# Patient Record
Sex: Female | Born: 1970 | Race: Black or African American | Hispanic: No | Marital: Married | Smoking: Never smoker
Health system: Southern US, Community
[De-identification: ages and names within clinical notes are randomized; demographics above are authoritative.]

## PROBLEM LIST (undated history)

## (undated) ENCOUNTER — Ambulatory Visit (HOSPITAL_COMMUNITY): Admission: EM | Payer: Federal, State, Local not specified - PPO

## (undated) DIAGNOSIS — D649 Anemia, unspecified: Secondary | ICD-10-CM

## (undated) DIAGNOSIS — K5900 Constipation, unspecified: Secondary | ICD-10-CM

## (undated) DIAGNOSIS — B9689 Other specified bacterial agents as the cause of diseases classified elsewhere: Secondary | ICD-10-CM

## (undated) DIAGNOSIS — N760 Acute vaginitis: Secondary | ICD-10-CM

## (undated) DIAGNOSIS — O99119 Other diseases of the blood and blood-forming organs and certain disorders involving the immune mechanism complicating pregnancy, unspecified trimester: Secondary | ICD-10-CM

## (undated) DIAGNOSIS — D696 Thrombocytopenia, unspecified: Secondary | ICD-10-CM

## (undated) DIAGNOSIS — R922 Inconclusive mammogram: Secondary | ICD-10-CM

## (undated) DIAGNOSIS — D693 Immune thrombocytopenic purpura: Secondary | ICD-10-CM

## (undated) HISTORY — DX: Other specified bacterial agents as the cause of diseases classified elsewhere: B96.89

## (undated) HISTORY — DX: Other specified bacterial agents as the cause of diseases classified elsewhere: N76.0

## (undated) HISTORY — DX: Inconclusive mammogram: R92.2

## (undated) HISTORY — DX: Thrombocytopenia, unspecified: D69.6

## (undated) HISTORY — DX: Immune thrombocytopenic purpura: D69.3

## (undated) HISTORY — PX: TUBAL LIGATION: SHX77

## (undated) HISTORY — DX: Anemia, unspecified: D64.9

## (undated) HISTORY — DX: Other diseases of the blood and blood-forming organs and certain disorders involving the immune mechanism complicating pregnancy, unspecified trimester: O99.119

## (undated) HISTORY — DX: Constipation, unspecified: K59.00

---

## 2000-04-10 ENCOUNTER — Encounter: Payer: Self-pay | Admitting: Internal Medicine

## 2000-04-10 ENCOUNTER — Encounter: Admission: RE | Admit: 2000-04-10 | Discharge: 2000-04-10 | Payer: Self-pay | Admitting: Internal Medicine

## 2000-04-29 ENCOUNTER — Other Ambulatory Visit: Admission: RE | Admit: 2000-04-29 | Discharge: 2000-04-29 | Payer: Self-pay | Admitting: Obstetrics and Gynecology

## 2001-02-18 ENCOUNTER — Encounter (INDEPENDENT_AMBULATORY_CARE_PROVIDER_SITE_OTHER): Payer: Self-pay

## 2001-02-18 ENCOUNTER — Inpatient Hospital Stay (HOSPITAL_COMMUNITY): Admission: AD | Admit: 2001-02-18 | Discharge: 2001-02-21 | Payer: Self-pay | Admitting: Obstetrics and Gynecology

## 2001-02-18 HISTORY — PX: TUBAL LIGATION: SHX77

## 2001-05-17 ENCOUNTER — Other Ambulatory Visit: Admission: RE | Admit: 2001-05-17 | Discharge: 2001-05-17 | Payer: Self-pay | Admitting: Obstetrics and Gynecology

## 2001-05-28 ENCOUNTER — Encounter: Payer: Self-pay | Admitting: Hematology and Oncology

## 2001-05-28 ENCOUNTER — Encounter: Admission: RE | Admit: 2001-05-28 | Discharge: 2001-05-28 | Payer: Self-pay | Admitting: Hematology and Oncology

## 2002-05-24 ENCOUNTER — Other Ambulatory Visit: Admission: RE | Admit: 2002-05-24 | Discharge: 2002-05-24 | Payer: Self-pay | Admitting: Obstetrics and Gynecology

## 2003-07-28 ENCOUNTER — Other Ambulatory Visit: Admission: RE | Admit: 2003-07-28 | Discharge: 2003-07-28 | Payer: Self-pay | Admitting: Obstetrics and Gynecology

## 2004-07-29 ENCOUNTER — Other Ambulatory Visit: Admission: RE | Admit: 2004-07-29 | Discharge: 2004-07-29 | Payer: Self-pay | Admitting: Obstetrics and Gynecology

## 2005-09-08 ENCOUNTER — Encounter: Admission: RE | Admit: 2005-09-08 | Discharge: 2005-09-08 | Payer: Self-pay | Admitting: Obstetrics and Gynecology

## 2005-09-08 ENCOUNTER — Other Ambulatory Visit: Admission: RE | Admit: 2005-09-08 | Discharge: 2005-09-08 | Payer: Self-pay | Admitting: Obstetrics and Gynecology

## 2006-03-17 DIAGNOSIS — R922 Inconclusive mammogram: Secondary | ICD-10-CM

## 2006-03-17 DIAGNOSIS — R9230 Dense breasts, unspecified: Secondary | ICD-10-CM

## 2006-03-17 HISTORY — DX: Dense breasts, unspecified: R92.30

## 2006-03-17 HISTORY — DX: Inconclusive mammogram: R92.2

## 2006-11-03 ENCOUNTER — Encounter: Admission: RE | Admit: 2006-11-03 | Discharge: 2006-11-03 | Payer: Self-pay | Admitting: Obstetrics and Gynecology

## 2008-08-24 ENCOUNTER — Encounter: Admission: RE | Admit: 2008-08-24 | Discharge: 2008-08-24 | Payer: Self-pay | Admitting: Internal Medicine

## 2008-09-07 ENCOUNTER — Encounter: Admission: RE | Admit: 2008-09-07 | Discharge: 2008-09-07 | Payer: Self-pay | Admitting: Internal Medicine

## 2009-09-21 ENCOUNTER — Encounter: Admission: RE | Admit: 2009-09-21 | Discharge: 2009-09-21 | Payer: Self-pay | Admitting: Internal Medicine

## 2009-11-01 HISTORY — PX: ENDOMETRIAL BIOPSY: SHX622

## 2010-08-02 NOTE — Op Note (Signed)
[facility]  Patient:    Anna Powell, Anna Powell Visit Number: [id number] MRN: [id number]          Service Type: OBS Location: 910A 9109 01 Attending Physician:  Jaymes Graff A Dictated by:   Pierre Bali Normand Sloop, M.D. Admit Date:  02/18/2001                             Operative Report  PREOPERATIVE DIAGNOSES:       1. Term pregnancy.                               2. Desires repeat cesarean section.                               3. Multiparity.                               4. Desires sterilization.  POSTOPERATIVE DIAGNOSES:      1. Term pregnancy.                               2. Desires repeat cesarean section.                               3. Multiparity.                               4. Desires sterilization.  PROCEDURES:                   1. Repeat cesarean section.                               2. Bilateral tubal ligation.  SURGEON:                      Naima A. Normand Sloop, M.D.  ASSISTANT:                    Maris Berger. Pennie Rushing, M.D.  ANESTHESIA:                   Spinal.  ESTIMATED BLOOD LOSS:         500 cc.  IV FLUIDS:                    3300 cc of crystalloid.  URINE OUTPUT:                 350 cc clear urine at the end of the procedure.  FINDINGS:                     Female infant in the vertex presentation.  Apgars 5 and 7.  However, the infant was crying when brought to the pediatricians, so no cord gas was sent.  The infant did have a nuchal cord x 1 that was easily reduced.  Weight was 8 lb 1 oz.  No meconium.  She had normal uterus, tubes and ovaries.  The placenta was sent to pathology.  PROCEDURE IN DETAIL:  The patient was taken to the operating room, where she was given spinal anesthesia.  She was placed in the dorsal supine position with a left lateral tilt, prepped and draped in normal sterile fashion.  A Pfannenstiel skin incision was made along her old incision with a knife and carried down to the fascia.  The fascia  was incised in the midline and dissected bilaterally using Mayo scissors and meticulous technique. Kochers x 2 were then placed over the superior aspect of the fascia, which was separated off the rectus muscles both bluntly and sharply using Mayo scissors.  The inferior aspect of the fascia was dissected in a similar fashion.  The rectus muscles were then separated in the midline.  The peritoneum was identified, tented up and entered sharply with Metzenbaum scissors.  The incision was dissected superiorly and inferiorly with good visualization of bowel and bladder.  The vesicouterine peritoneum was identified, tented up and entered sharply, then separated from the uterus both bluntly and sharply.  A bladder blade was then inserted.  A lower transverse uterine incision was then made with a knife and the incision was extended bilaterally bluntly.  the head was delivered without difficulty through the incision.  A nuchal cord x 1 was easily reduced.  The body was delivered without difficulty.  The cord was clamped and cut.  Cord bloods were obtained.  The placenta was manually removed without difficulty.  The uterus was cleared of all clot and debris. The uterine incision was repaired with 0 Vicryl in a running lock fashion. The patients left fallopian tube was then identified, followed to the fimbriated end, grasped in the midisthmic portion with a Babcock clamp and suture ligated with 2-0 plain.  It was then doubly ligated with a free tie.  A portion of the tube was removed.  Hemostasis was noted.  The patients right fallopian tube was identified, manipulated in a similar fashion, suture ligated with 2-0 plain and a portion of the tube excised in the midisthmic portion.  Hemostasis was again noted.  Attention was then turned back to the incision.  There was a little bit of oozing near the left angle.  This was made hemostatic with a figure-of-eight stitch.  The abdomen was irrigated  with saline.  Hemostasis was assured.  The muscle was reapproximated with a figure-of-eight stitch using 2-0 Vicryl.  Hemostasis was assured.  The fascia was then repaired with 0 Vicryl in a running fashion.  Hemostasis was assured. The subcutaneous tissue was irrigated copiously and made hemostatic using Bovie cautery.  Hemostasis was assured.  The skin was closed with staples. Sponge, lap, and needle counts were correct x 2.  The patient went to the recovery room in stable condition. Dictated by:   Pierre Bali. Normand Sloop, M.D. Attending Physician:  Michael Litter DD:  02/18/01 TD:  02/18/01 Job: [id number]

## 2010-08-02 NOTE — H&P (Signed)
[facility]  Patient:    Anna Powell, Anna Powell Visit Number: [id number] MRN: [id number]          Service Type: Attending:  Naima A. Normand Sloop, M.D. Dictated by:   Pierre Bali. Normand Sloop, M.D.                           History and Physical  HISTORY OF PRESENT ILLNESS:   Patient is a 40 year old African-American, female, G3, P1-0-1-1.  Last menstrual period was May 29, 2000.  Estimated date of confinement was February 25, 2001, consistent with a 19-week ultrasound.  Estimated gestational age is 13 weeks.  Patient presents to the [facility] for a repeat cesarean section, tubal ligation.  Patients pregnancy was complicated by thrombocytopenia.  First prenatal values were 125 and every month, they were 106, 109, 103, and 110, and have remained low but stable.  She is also rubella nonimmune and needs vaccination postpartum and has a history of previous cesarean section and has decided on repeat cesarean section and tubal ligation.  PRENATAL LABS:                Prenatal hemoglobin was 10.9 and hematocrit 35.2.  O positive, antibody negative.  Sickle cell prep was negative.  RPR was nonreactive, rubella nonimmune.  Hepatitis B surface antigen was negative. Pap was within normal limits.  Gonorrhea and Chlamydia both were negative. Ultrasound was size equal to dates.  A one-hour glucola was 114.  GBS was negative.  MEDICATIONS:                  1. Prenatal vitamins.                               2. Claritin p.r.n.  PAST OB HISTORY:              Elective abortion in 1988 without any complications and a full-term cesarean section in August 1996 secondary to failure to descend.  PAST GYN HISTORY:             Menarche at age 44 and occurring every 53 to 30 days, lasting for five to seven days.  No history of sexually transmitted diseases.  ALLERGIES:                    Patient has just SEASONAL ALLERGIES but no known drug allergies.  FAMILY HISTORY:                Significant for hypertension.  PHYSICAL EXAMINATION:         On November 26 in the office, patient weighed 180.5 pounds, fetal heart tones were 140s.  Blood pressure was 112/62, and her last cervix exam was found to be fingertip, long, and high.  The remainder of her exam will be done at her admission.  PLAN:                         Patient will be admitted for cesarean section and tubal ligation.  The risks are but not limited to bleeding, infection, damage to major abdominal organs such as bowel and bladder.  Patient was given the option to vaginal birth after cesarean earlier in the pregnancy and decided for repeat cesarean section. Dictated by:   Pierre Bali. Normand Sloop, M.D. Attending:  Naima A. Dillard, M.D. DD:  02/16/01 TD:  02/16/01 Job:  [id number]

## 2010-08-02 NOTE — Discharge Summary (Signed)
[facility]  Patient:    Anna Powell, Anna Powell Visit Number: [id number] MRN: [id number]          Service Type: OBS Location: 910A 9109 01 Attending Physician:  Michael Litter Dictated by:   Mack Guise, C.N.M. Admit Date:  02/18/2001 Discharge Date: 02/21/2001                             Discharge Summary  ADMISSION DIAGNOSES: 1. Intrauterine pregnancy at term. 2. Repeat cesarean section. 3. Desires sterilization.  PROCEDURES: 1. Repeat low transverse cesarean delivery. 2. Bilateral tubal sterilization.  DISCHARGE DIAGNOSES: 1. Intrauterine pregnancy at term. 2. Repeat cesarean section. 3. Desires sterilization.  HISTORY OF PRESENT ILLNESS:  Ms. Delos Haring is a 40 year old, gravida 3, para 1-0-1-1, who presented for repeat cesarean section and tubal ligation.  Her pregnancy was complicated by thrombocytopenia, but she has remained stable, and has done well in the postoperative period.  On 02/18/01, she underwent a repeat cesarean delivery and bilateral tubal sterilization by Dr. Jaymes Graff with the birth of an 8 pound 1 ounce female infant with Apgar scores of 5 at one minute, 7 at five minutes, and 9 at 10 minutes.  Her hemoglobin on the first postoperative day was 9.3, platelets 96,000, and on this her third postoperative day, she is judged to be in satisfactory condition for discharge.  Both mother and baby have done well in the immediate postpartum period.  DISCHARGE INSTRUCTIONS:  Per [facility] handout.  DISCHARGE MEDICATIONS: 1. Motrin 600 mg p.o. q.6h. p.r.n. pain. 2. Tylox one or two p.o. q.3-4h. pain. 3. Prenatal vitamins.  FOLLOWUP:  The patient will follow up at the office of [facility] in 6 weeks. Dictated by:   Mack Guise, C.N.M. Attending Physician:  Michael Litter DD:  02/21/01 TD:  02/21/01 Job: [id number]

## 2010-09-16 ENCOUNTER — Other Ambulatory Visit: Payer: Self-pay | Admitting: Internal Medicine

## 2010-09-16 DIAGNOSIS — Z1231 Encounter for screening mammogram for malignant neoplasm of breast: Secondary | ICD-10-CM

## 2010-09-27 ENCOUNTER — Ambulatory Visit
Admission: RE | Admit: 2010-09-27 | Discharge: 2010-09-27 | Disposition: A | Discharge status: Home / Self Care | Payer: Federal, State, Local not specified - PPO | Source: Ambulatory Visit | Attending: Internal Medicine | Admitting: Internal Medicine

## 2010-09-27 DIAGNOSIS — Z1231 Encounter for screening mammogram for malignant neoplasm of breast: Secondary | ICD-10-CM

## 2010-10-02 ENCOUNTER — Other Ambulatory Visit: Payer: Self-pay | Admitting: Internal Medicine

## 2010-10-02 DIAGNOSIS — R928 Other abnormal and inconclusive findings on diagnostic imaging of breast: Secondary | ICD-10-CM

## 2010-10-07 ENCOUNTER — Ambulatory Visit
Admission: RE | Admit: 2010-10-07 | Discharge: 2010-10-07 | Disposition: A | Discharge status: Home / Self Care | Payer: Federal, State, Local not specified - PPO | Source: Ambulatory Visit | Attending: Internal Medicine | Admitting: Internal Medicine

## 2010-10-07 DIAGNOSIS — R928 Other abnormal and inconclusive findings on diagnostic imaging of breast: Secondary | ICD-10-CM

## 2011-08-22 ENCOUNTER — Other Ambulatory Visit: Payer: Self-pay | Admitting: Internal Medicine

## 2011-08-22 DIAGNOSIS — Z1231 Encounter for screening mammogram for malignant neoplasm of breast: Secondary | ICD-10-CM

## 2011-09-24 ENCOUNTER — Ambulatory Visit: Payer: Federal, State, Local not specified - PPO | Admitting: Obstetrics and Gynecology

## 2011-10-08 ENCOUNTER — Ambulatory Visit: Payer: Federal, State, Local not specified - PPO

## 2011-10-14 ENCOUNTER — Ambulatory Visit: Payer: Federal, State, Local not specified - PPO

## 2011-11-03 ENCOUNTER — Ambulatory Visit: Payer: Federal, State, Local not specified - PPO | Admitting: Obstetrics and Gynecology

## 2011-11-03 ENCOUNTER — Ambulatory Visit
Admission: RE | Admit: 2011-11-03 | Discharge: 2011-11-03 | Disposition: A | Discharge status: Home / Self Care | Payer: Federal, State, Local not specified - PPO | Source: Ambulatory Visit | Attending: Internal Medicine | Admitting: Internal Medicine

## 2011-11-03 DIAGNOSIS — Z1231 Encounter for screening mammogram for malignant neoplasm of breast: Secondary | ICD-10-CM

## 2011-11-05 ENCOUNTER — Other Ambulatory Visit: Payer: Self-pay | Admitting: Internal Medicine

## 2011-11-05 DIAGNOSIS — R928 Other abnormal and inconclusive findings on diagnostic imaging of breast: Secondary | ICD-10-CM

## 2011-11-24 ENCOUNTER — Ambulatory Visit
Admission: RE | Admit: 2011-11-24 | Discharge: 2011-11-24 | Disposition: A | Discharge status: Home / Self Care | Payer: Federal, State, Local not specified - PPO | Source: Ambulatory Visit | Attending: Internal Medicine | Admitting: Internal Medicine

## 2011-11-24 DIAGNOSIS — R928 Other abnormal and inconclusive findings on diagnostic imaging of breast: Secondary | ICD-10-CM

## 2011-12-09 ENCOUNTER — Ambulatory Visit: Payer: Federal, State, Local not specified - PPO | Admitting: Obstetrics and Gynecology

## 2012-01-23 ENCOUNTER — Ambulatory Visit (INDEPENDENT_AMBULATORY_CARE_PROVIDER_SITE_OTHER): Payer: Federal, State, Local not specified - PPO | Admitting: Obstetrics and Gynecology

## 2012-01-23 ENCOUNTER — Other Ambulatory Visit: Payer: Self-pay | Admitting: Obstetrics and Gynecology

## 2012-01-23 ENCOUNTER — Encounter: Payer: Self-pay | Admitting: Obstetrics and Gynecology

## 2012-01-23 VITALS — BP 110/72 | HR 82 | Ht 63.0 in | Wt 155.0 lb

## 2012-01-23 DIAGNOSIS — Z124 Encounter for screening for malignant neoplasm of cervix: Secondary | ICD-10-CM

## 2012-01-23 DIAGNOSIS — Z113 Encounter for screening for infections with a predominantly sexual mode of transmission: Secondary | ICD-10-CM

## 2012-01-23 NOTE — Progress Notes (Signed)
Last Pap: 09/23/10 WNL: Yes Regular Periods:no Contraception: BTL  Monthly Breast exam:no Tetanus<88yrs:no pt unsure Nl.Bladder Function:yes Daily BMs:no Healthy Diet:yes Calcium:no Mammogram:yes Date of Mammogram: 11/2011 Exercise:yes Have often Exercise: 3-4 times per week  Seatbelt: yes Abuse at home: no Stressful work:yes Sigmoid-colonoscopy: n/a Bone Density: No PCP: Dr. Dorothyann Peng  Change in PMH: none  Change in [facility]: none BP 110/72  Pulse 82  Ht 5\' 3"  (1.6 m)  Wt 155 lb (70.308 kg)  BMI 27.46 kg/m2  LMP 01/10/2012 Pt with complaints:no Physical Examination: General appearance - alert, well appearing, and in no distress Mental status - normal mood, behavior, speech, dress, motor activity, and thought processes Neck - supple, no significant adenopathy,  thyroid exam: thyroid is normal in size without nodules or tenderness Chest - clear to auscultation, no wheezes, rales or rhonchi, symmetric air entry Heart - normal rate and regular rhythm Abdomen - soft, nontender, nondistended, no masses or organomegaly Breasts - breasts appear normal, no suspicious masses, no skin or nipple changes or axillary nodes Pelvic - normal external genitalia, vulva, vagina, cervix, uterus and adnexa Rectal - rectal exam not indicated Back exam - full range of motion, no tenderness, palpable spasm or pain on motion Neurological - alert, oriented, normal speech, no focal findings or movement disorder noted Musculoskeletal - no joint tenderness, deformity or swelling Extremities - no edema, redness or tenderness in the calves or thighs Skin - normal coloration and turgor, no rashes, no suspicious skin lesions noted Routine exam Pap sent yes Mammogram due no BTL used for contraception RT 1 yr

## 2012-01-23 NOTE — Addendum Note (Signed)
Addended by: Loralyn Freshwater on: 01/23/2012 10:54 AM   Modules accepted: Orders

## 2012-01-26 LAB — PAP IG W/ RFLX HPV ASCU

## 2012-02-17 ENCOUNTER — Telehealth: Payer: Self-pay | Admitting: Obstetrics and Gynecology

## 2012-02-17 NOTE — Telephone Encounter (Signed)
Tc to pt regarding msg.  Lm on vm to call back. 

## 2012-03-09 NOTE — Telephone Encounter (Signed)
Late entry 02/17/12:  Pt returned call, informed HIV was non reactive and pap was normal, pt voices agreement.

## 2013-01-13 ENCOUNTER — Other Ambulatory Visit: Payer: Self-pay

## 2013-01-13 DIAGNOSIS — Z1231 Encounter for screening mammogram for malignant neoplasm of breast: Secondary | ICD-10-CM

## 2013-01-19 ENCOUNTER — Ambulatory Visit
Admission: RE | Admit: 2013-01-19 | Discharge: 2013-01-19 | Disposition: A | Discharge status: Home / Self Care | Payer: Federal, State, Local not specified - PPO | Source: Ambulatory Visit

## 2013-01-19 DIAGNOSIS — Z1231 Encounter for screening mammogram for malignant neoplasm of breast: Secondary | ICD-10-CM

## 2013-02-15 ENCOUNTER — Ambulatory Visit: Payer: Federal, State, Local not specified - PPO

## 2013-12-21 ENCOUNTER — Other Ambulatory Visit: Payer: Self-pay

## 2013-12-21 DIAGNOSIS — Z1239 Encounter for other screening for malignant neoplasm of breast: Secondary | ICD-10-CM

## 2014-01-16 ENCOUNTER — Encounter: Payer: Self-pay | Admitting: Obstetrics and Gynecology

## 2014-02-01 ENCOUNTER — Ambulatory Visit
Admission: RE | Admit: 2014-02-01 | Discharge: 2014-02-01 | Disposition: A | Discharge status: Home / Self Care | Payer: Federal, State, Local not specified - PPO | Source: Ambulatory Visit

## 2014-02-01 DIAGNOSIS — Z1239 Encounter for other screening for malignant neoplasm of breast: Secondary | ICD-10-CM

## 2015-01-10 ENCOUNTER — Other Ambulatory Visit: Payer: Self-pay

## 2015-01-10 DIAGNOSIS — Z1231 Encounter for screening mammogram for malignant neoplasm of breast: Secondary | ICD-10-CM

## 2015-02-05 ENCOUNTER — Ambulatory Visit
Admission: RE | Admit: 2015-02-05 | Discharge: 2015-02-05 | Disposition: A | Discharge status: Home / Self Care | Payer: Federal, State, Local not specified - PPO | Source: Ambulatory Visit

## 2015-02-05 DIAGNOSIS — Z1231 Encounter for screening mammogram for malignant neoplasm of breast: Secondary | ICD-10-CM

## 2015-07-30 DIAGNOSIS — M5441 Lumbago with sciatica, right side: Secondary | ICD-10-CM | POA: Diagnosis not present

## 2015-07-31 DIAGNOSIS — M545 Low back pain: Secondary | ICD-10-CM | POA: Diagnosis not present

## 2015-07-31 DIAGNOSIS — D649 Anemia, unspecified: Secondary | ICD-10-CM | POA: Diagnosis not present

## 2015-07-31 DIAGNOSIS — R0602 Shortness of breath: Secondary | ICD-10-CM | POA: Diagnosis not present

## 2015-08-02 DIAGNOSIS — M545 Low back pain: Secondary | ICD-10-CM | POA: Diagnosis not present

## 2015-08-15 DIAGNOSIS — M545 Low back pain: Secondary | ICD-10-CM | POA: Diagnosis not present

## 2015-08-15 DIAGNOSIS — M5416 Radiculopathy, lumbar region: Secondary | ICD-10-CM | POA: Diagnosis not present

## 2015-08-20 DIAGNOSIS — M5416 Radiculopathy, lumbar region: Secondary | ICD-10-CM | POA: Diagnosis not present

## 2015-08-20 DIAGNOSIS — M545 Low back pain: Secondary | ICD-10-CM | POA: Diagnosis not present

## 2015-08-22 DIAGNOSIS — M545 Low back pain: Secondary | ICD-10-CM | POA: Diagnosis not present

## 2015-08-22 DIAGNOSIS — M5416 Radiculopathy, lumbar region: Secondary | ICD-10-CM | POA: Diagnosis not present

## 2015-08-27 DIAGNOSIS — M5416 Radiculopathy, lumbar region: Secondary | ICD-10-CM | POA: Diagnosis not present

## 2015-08-27 DIAGNOSIS — M545 Low back pain: Secondary | ICD-10-CM | POA: Diagnosis not present

## 2015-08-29 DIAGNOSIS — M545 Low back pain: Secondary | ICD-10-CM | POA: Diagnosis not present

## 2015-08-29 DIAGNOSIS — M5416 Radiculopathy, lumbar region: Secondary | ICD-10-CM | POA: Diagnosis not present

## 2015-09-03 DIAGNOSIS — M5416 Radiculopathy, lumbar region: Secondary | ICD-10-CM | POA: Diagnosis not present

## 2015-09-03 DIAGNOSIS — M545 Low back pain: Secondary | ICD-10-CM | POA: Diagnosis not present

## 2015-09-05 DIAGNOSIS — M545 Low back pain: Secondary | ICD-10-CM | POA: Diagnosis not present

## 2015-09-05 DIAGNOSIS — M5416 Radiculopathy, lumbar region: Secondary | ICD-10-CM | POA: Diagnosis not present

## 2015-09-12 DIAGNOSIS — M5416 Radiculopathy, lumbar region: Secondary | ICD-10-CM | POA: Diagnosis not present

## 2015-09-12 DIAGNOSIS — M545 Low back pain: Secondary | ICD-10-CM | POA: Diagnosis not present

## 2015-10-30 DIAGNOSIS — K080 Exfoliation of teeth due to systemic causes: Secondary | ICD-10-CM | POA: Diagnosis not present

## 2016-02-12 ENCOUNTER — Other Ambulatory Visit: Payer: Self-pay | Admitting: Obstetrics and Gynecology

## 2016-02-12 DIAGNOSIS — Z1231 Encounter for screening mammogram for malignant neoplasm of breast: Secondary | ICD-10-CM

## 2016-02-28 DIAGNOSIS — Z01419 Encounter for gynecological examination (general) (routine) without abnormal findings: Secondary | ICD-10-CM | POA: Diagnosis not present

## 2016-02-28 DIAGNOSIS — Z113 Encounter for screening for infections with a predominantly sexual mode of transmission: Secondary | ICD-10-CM | POA: Diagnosis not present

## 2016-02-28 DIAGNOSIS — Z124 Encounter for screening for malignant neoplasm of cervix: Secondary | ICD-10-CM | POA: Diagnosis not present

## 2016-03-04 ENCOUNTER — Ambulatory Visit
Admission: RE | Admit: 2016-03-04 | Discharge: 2016-03-04 | Disposition: A | Discharge status: Home / Self Care | Payer: Federal, State, Local not specified - PPO | Source: Ambulatory Visit | Attending: Obstetrics and Gynecology | Admitting: Obstetrics and Gynecology

## 2016-03-04 DIAGNOSIS — Z1231 Encounter for screening mammogram for malignant neoplasm of breast: Secondary | ICD-10-CM | POA: Diagnosis not present

## 2016-05-14 DIAGNOSIS — K080 Exfoliation of teeth due to systemic causes: Secondary | ICD-10-CM | POA: Diagnosis not present

## 2016-08-21 DIAGNOSIS — K5900 Constipation, unspecified: Secondary | ICD-10-CM | POA: Diagnosis not present

## 2016-08-21 DIAGNOSIS — H01134 Eczematous dermatitis of left upper eyelid: Secondary | ICD-10-CM | POA: Diagnosis not present

## 2016-09-08 DIAGNOSIS — M545 Low back pain: Secondary | ICD-10-CM | POA: Diagnosis not present

## 2016-09-08 DIAGNOSIS — Z6829 Body mass index (BMI) 29.0-29.9, adult: Secondary | ICD-10-CM | POA: Diagnosis not present

## 2016-09-08 DIAGNOSIS — G8929 Other chronic pain: Secondary | ICD-10-CM | POA: Diagnosis not present

## 2016-10-14 DIAGNOSIS — M5441 Lumbago with sciatica, right side: Secondary | ICD-10-CM | POA: Diagnosis not present

## 2017-03-05 DIAGNOSIS — J329 Chronic sinusitis, unspecified: Secondary | ICD-10-CM | POA: Diagnosis not present

## 2017-05-21 DIAGNOSIS — K080 Exfoliation of teeth due to systemic causes: Secondary | ICD-10-CM | POA: Diagnosis not present

## 2017-06-08 ENCOUNTER — Other Ambulatory Visit: Payer: Self-pay | Admitting: Obstetrics and Gynecology

## 2017-06-08 DIAGNOSIS — Z1231 Encounter for screening mammogram for malignant neoplasm of breast: Secondary | ICD-10-CM

## 2017-06-09 DIAGNOSIS — Z6828 Body mass index (BMI) 28.0-28.9, adult: Secondary | ICD-10-CM | POA: Diagnosis not present

## 2017-06-09 DIAGNOSIS — Z113 Encounter for screening for infections with a predominantly sexual mode of transmission: Secondary | ICD-10-CM | POA: Diagnosis not present

## 2017-06-09 DIAGNOSIS — Z01419 Encounter for gynecological examination (general) (routine) without abnormal findings: Secondary | ICD-10-CM | POA: Diagnosis not present

## 2017-06-10 ENCOUNTER — Ambulatory Visit: Payer: Federal, State, Local not specified - PPO

## 2017-06-12 ENCOUNTER — Ambulatory Visit
Admission: RE | Admit: 2017-06-12 | Discharge: 2017-06-12 | Disposition: A | Discharge status: Home / Self Care | Payer: Federal, State, Local not specified - PPO | Source: Ambulatory Visit | Attending: Obstetrics and Gynecology | Admitting: Obstetrics and Gynecology

## 2017-06-12 DIAGNOSIS — Z1231 Encounter for screening mammogram for malignant neoplasm of breast: Secondary | ICD-10-CM | POA: Diagnosis not present

## 2017-06-22 DIAGNOSIS — M5442 Lumbago with sciatica, left side: Secondary | ICD-10-CM | POA: Diagnosis not present

## 2017-07-01 DIAGNOSIS — M545 Low back pain: Secondary | ICD-10-CM | POA: Diagnosis not present

## 2017-07-15 DIAGNOSIS — M545 Low back pain: Secondary | ICD-10-CM | POA: Diagnosis not present

## 2017-07-15 DIAGNOSIS — J209 Acute bronchitis, unspecified: Secondary | ICD-10-CM | POA: Diagnosis not present

## 2017-07-30 DIAGNOSIS — M545 Low back pain: Secondary | ICD-10-CM | POA: Diagnosis not present

## 2017-07-30 DIAGNOSIS — M79604 Pain in right leg: Secondary | ICD-10-CM | POA: Diagnosis not present

## 2017-10-21 DIAGNOSIS — X500XXA Overexertion from strenuous movement or load, initial encounter: Secondary | ICD-10-CM | POA: Diagnosis not present

## 2017-10-21 DIAGNOSIS — M545 Low back pain: Secondary | ICD-10-CM | POA: Diagnosis not present

## 2017-11-19 DIAGNOSIS — K080 Exfoliation of teeth due to systemic causes: Secondary | ICD-10-CM | POA: Diagnosis not present

## 2017-12-14 ENCOUNTER — Ambulatory Visit: Payer: Self-pay | Admitting: Internal Medicine

## 2018-04-12 ENCOUNTER — Ambulatory Visit: Payer: Federal, State, Local not specified - PPO | Admitting: Internal Medicine

## 2018-04-12 ENCOUNTER — Encounter: Payer: Self-pay | Admitting: Internal Medicine

## 2018-04-12 VITALS — BP 112/68 | HR 82 | Temp 98.3°F | Ht 62.6 in | Wt 150.6 lb

## 2018-04-12 DIAGNOSIS — J0110 Acute frontal sinusitis, unspecified: Secondary | ICD-10-CM

## 2018-04-12 DIAGNOSIS — R0982 Postnasal drip: Secondary | ICD-10-CM | POA: Diagnosis not present

## 2018-04-12 MED ORDER — AZITHROMYCIN 250 MG PO TABS: ORAL_TABLET | ORAL | 0 refills | Status: AC

## 2018-04-12 MED ORDER — HYDROCODONE-HOMATROPINE 5-1.5 MG/5ML PO SYRP: 5.0000 mL | ORAL_SOLUTION | Freq: Four times a day (QID) | ORAL | 0 refills | Status: DC | PRN

## 2018-04-12 MED ORDER — BENZONATATE 100 MG PO CAPS: 100.0000 mg | ORAL_CAPSULE | Freq: Four times a day (QID) | ORAL | 1 refills | Status: AC | PRN

## 2018-04-12 NOTE — Progress Notes (Signed)
Subjective:     Patient ID: Anna Powell , female    DOB: 05-30-1970 , 48 y.o.   MRN: [id number]   Chief Complaint  Patient presents with  . cough    HPI  Cough  This is a recurrent problem. The current episode started in the past 7 days. The problem has been gradually worsening. The problem occurs every few minutes. The cough is non-productive. Associated symptoms include headaches and postnasal drip. The symptoms are aggravated by lying down. She has tried OTC cough suppressant for the symptoms. The treatment provided no relief. Her past medical history is significant for environmental allergies.     Past Medical History:  Diagnosis Date  . Bacterial vaginosis   . Breast density 2008   right breast   . Constipation   . Gestational thrombocytopenia (HCC)   . ITP (idiopathic thrombocytopenic purpura)      Family History  Problem Relation Age of Onset  . Hypertension Maternal Grandmother   . Cancer Paternal Grandfather        lung  . Heart disease Other        maternal great-grandmother heart attack   . Hypertension Mother   . Non-Hodgkin's lymphoma Father      Current Outpatient Medications:  .  meloxicam (MOBIC) 15 MG tablet, Take 15 mg by mouth as needed., Disp: , Rfl:  .  azithromycin (ZITHROMAX Z-PAK) 250 MG tablet, Take 2 tablets (500 mg) on  Day 1,  followed by 1 tablet (250 mg) once daily on Days 2 through 5., Disp: 6 each, Rfl: 0 .  benzonatate (TESSALON PERLES) 100 MG capsule, Take 1 capsule (100 mg total) by mouth every 6 (six) hours as needed for cough., Disp: 30 capsule, Rfl: 1 .  HYDROcodone-homatropine (HYDROMET) 5-1.5 MG/5ML syrup, Take 5 mLs by mouth every 6 (six) hours as needed for cough., Disp: 120 mL, Rfl: 0   Allergies  Allergen Reactions  . Monistat [Miconazole]      Review of Systems  Constitutional: Negative.   HENT: Positive for postnasal drip and sinus pressure.   Respiratory: Positive for cough.   Cardiovascular: Negative.    Allergic/Immunologic: Positive for environmental allergies.  Neurological: Positive for headaches.  Psychiatric/Behavioral: Negative.      Today's Vitals   04/12/18 1107  BP: 112/68  Pulse: 82  Temp: 98.3 F (36.8 C)  TempSrc: Oral  SpO2: 97%  Weight: 150 lb 9.6 oz (68.3 kg)  Height: 5' 2.6" (1.59 m)   Body mass index is 27.02 kg/m.   Objective:  Physical Exam Vitals signs and nursing note reviewed.  Constitutional:      Appearance: Normal appearance. She is ill-appearing.  HENT:     Head: Normocephalic and atraumatic.     Comments: B/l frontal sinuses tender to palpation    Right Ear: Tympanic membrane, ear canal and external ear normal.     Left Ear: Tympanic membrane, ear canal and external ear normal.     Mouth/Throat:     Mouth: Mucous membranes are moist.     Pharynx: Oropharynx is clear. Posterior oropharyngeal erythema present.  Cardiovascular:     Rate and Rhythm: Normal rate and regular rhythm.     Heart sounds: Normal heart sounds.  Pulmonary:     Effort: Pulmonary effort is normal.     Breath sounds: Normal breath sounds.  Skin:    General: Skin is warm.  Neurological:     General: No focal deficit present.  Mental Status: She is alert and oriented to person, place, and time.         Assessment And Plan:     1. Acute non-recurrent frontal sinusitis  She was given rx zpak and hydromet syrup to use nightly prn. She is encouraged to avoid dairy until her sx have resolved.   2. Postnasal drip  She is encouraged to take xyzal nightly. She was given rx tessalon perles to use prn for her cough.   Gwynneth Aliment, MD

## 2018-05-20 ENCOUNTER — Other Ambulatory Visit: Payer: Self-pay | Admitting: Internal Medicine

## 2018-05-20 DIAGNOSIS — Z1231 Encounter for screening mammogram for malignant neoplasm of breast: Secondary | ICD-10-CM

## 2018-06-10 ENCOUNTER — Encounter: Payer: Self-pay | Admitting: Nurse Practitioner

## 2018-06-10 ENCOUNTER — Other Ambulatory Visit: Payer: Self-pay

## 2018-06-10 ENCOUNTER — Ambulatory Visit (INDEPENDENT_AMBULATORY_CARE_PROVIDER_SITE_OTHER): Payer: Federal, State, Local not specified - PPO | Admitting: Nurse Practitioner

## 2018-06-10 DIAGNOSIS — M545 Low back pain, unspecified: Secondary | ICD-10-CM | POA: Insufficient documentation

## 2018-06-10 DIAGNOSIS — Z760 Encounter for issue of repeat prescription: Secondary | ICD-10-CM | POA: Diagnosis not present

## 2018-06-10 MED ORDER — MELOXICAM 15 MG PO TABS: 15.0000 mg | ORAL_TABLET | ORAL | 0 refills | Status: DC | PRN

## 2018-06-10 NOTE — Progress Notes (Addendum)
   Subjective:    This visit type was conducted due to national recommendations for restrictions regarding the COVID-19 Pandemic (e.g. social distancing). This format is felt to be most appropriate for this patient at this time.  All issues noted in this document were discussed and addressed.  No physical exam was performed (except for noted visual exam findings with Video Visits).  Please refer to the patient's chart (MyChart message for video visits and phone note for telephone visits) for the patient's consent to telehealth for [facility].    Patient ID: Anna Powell , female    DOB: Jan 24, 1971 , 48 y.o.   MRN: [id number]  Video Visit Patient Location: Work Provider Location: Office  Chief Complaint  Patient presents with  . Back Pain   History of Present Illness: "she would like a refill" she has a new exercise plan and having low back discomfort.    Past Medical History:  Diagnosis Date  . Bacterial vaginosis   . Breast density 2008   right breast   . Constipation   . Gestational thrombocytopenia (HCC)   . ITP (idiopathic thrombocytopenic purpura)      Family History  Problem Relation Age of Onset  . Hypertension Maternal Grandmother   . Cancer Paternal Grandfather        lung  . Heart disease Other        maternal great-grandmother heart attack   . Hypertension Mother   . Non-Hodgkin's lymphoma Father     Current Outpatient Medications:  .  benzonatate (TESSALON PERLES) 100 MG capsule, Take 1 capsule (100 mg total) by mouth every 6 (six) hours as needed for cough., Disp: 30 capsule, Rfl: 1 .  HYDROcodone-homatropine (HYDROMET) 5-1.5 MG/5ML syrup, Take 5 mLs by mouth every 6 (six) hours as needed for cough., Disp: 120 mL, Rfl: 0 .  meloxicam (MOBIC) 15 MG tablet, Take 1 tablet (15 mg total) by mouth as needed., Disp: 90 tablet, Rfl: 0   Allergies  Allergen Reactions  . Monistat [Miconazole]       There were no vitals filed for this visit. There is no height or  weight on file to calculate BMI.   Review of Systems  Musculoskeletal: Positive for back pain (Low back pain).  All other systems reviewed and are negative.  Observations/Objective: Currently appears comfortable.  Assessment and Plan:  1. Acute low back pain without sciatica, unspecified back pain laterality - will refill meloxicam for as needed - encouraged to stretch regularly and use heating pads as needed  Follow Up Instructions: - return call to office as needed if symptoms worsen  COVID-19 Education: The signs and symptoms of COVID-19 were discussed with the patient and how to seek care for testing (follow up with PCP or arrange E-visit).  The importance of social distancing was discussed today.   Patient Risk:   After full review of this patients clinical status, I feel that they are at least moderate risk at this time.   I discussed the assessment and treatment plan with the patient. The patient was provided an opportunity to ask questions and all were answered. The patient agreed with the plan and demonstrated an understanding of the instructions.   The patient was advised to call back or seek an in-person evaluation if the symptoms worsen or if the condition fails to improve as anticipated.  I provided 20 minutes of non-face-to-face time during this encounter.   Arnette Felts, FNP

## 2018-06-23 ENCOUNTER — Other Ambulatory Visit: Payer: Self-pay | Admitting: Internal Medicine

## 2018-06-23 DIAGNOSIS — Z1231 Encounter for screening mammogram for malignant neoplasm of breast: Secondary | ICD-10-CM

## 2018-08-23 ENCOUNTER — Ambulatory Visit
Admission: RE | Admit: 2018-08-23 | Discharge: 2018-08-23 | Disposition: A | Discharge status: Home / Self Care | Payer: Federal, State, Local not specified - PPO | Source: Ambulatory Visit | Attending: Internal Medicine | Admitting: Internal Medicine

## 2018-08-23 ENCOUNTER — Other Ambulatory Visit: Payer: Self-pay

## 2018-08-23 DIAGNOSIS — Z1231 Encounter for screening mammogram for malignant neoplasm of breast: Secondary | ICD-10-CM | POA: Diagnosis not present

## 2018-09-03 ENCOUNTER — Other Ambulatory Visit: Payer: Self-pay | Admitting: Nurse Practitioner

## 2018-09-03 DIAGNOSIS — M545 Low back pain, unspecified: Secondary | ICD-10-CM

## 2018-09-10 DIAGNOSIS — Z6825 Body mass index (BMI) 25.0-25.9, adult: Secondary | ICD-10-CM | POA: Diagnosis not present

## 2018-09-10 DIAGNOSIS — Z124 Encounter for screening for malignant neoplasm of cervix: Secondary | ICD-10-CM | POA: Diagnosis not present

## 2018-09-10 DIAGNOSIS — Z01419 Encounter for gynecological examination (general) (routine) without abnormal findings: Secondary | ICD-10-CM | POA: Diagnosis not present

## 2018-09-10 DIAGNOSIS — Z113 Encounter for screening for infections with a predominantly sexual mode of transmission: Secondary | ICD-10-CM | POA: Diagnosis not present

## 2018-12-01 ENCOUNTER — Telehealth: Payer: Self-pay | Admitting: Internal Medicine

## 2018-12-01 ENCOUNTER — Other Ambulatory Visit: Payer: Self-pay | Admitting: Internal Medicine

## 2018-12-01 MED ORDER — LUBIPROSTONE 24 MCG PO CAPS: 24.0000 ug | ORAL_CAPSULE | Freq: Two times a day (BID) | ORAL | 5 refills | Status: DC

## 2018-12-01 NOTE — Telephone Encounter (Signed)
PA Juan Quam [address]MC[address] KEY# [id number]

## 2018-12-27 ENCOUNTER — Ambulatory Visit (INDEPENDENT_AMBULATORY_CARE_PROVIDER_SITE_OTHER): Payer: Federal, State, Local not specified - PPO | Admitting: Internal Medicine

## 2018-12-27 ENCOUNTER — Encounter: Payer: Self-pay | Admitting: Internal Medicine

## 2018-12-27 ENCOUNTER — Other Ambulatory Visit: Payer: Self-pay

## 2018-12-27 VITALS — BP 110/68 | HR 63 | Temp 98.7°F | Ht 62.8 in | Wt 138.4 lb

## 2018-12-27 DIAGNOSIS — M62838 Other muscle spasm: Secondary | ICD-10-CM | POA: Diagnosis not present

## 2018-12-27 DIAGNOSIS — M545 Low back pain, unspecified: Secondary | ICD-10-CM

## 2018-12-27 MED ORDER — KETOROLAC TROMETHAMINE 60 MG/2ML IM SOLN
60.0000 mg | Freq: Once | INTRAMUSCULAR | Status: AC
  Administered 2018-12-27: 10:00:00 60 mg via INTRAMUSCULAR

## 2018-12-27 NOTE — Patient Instructions (Signed)
Acute Back Pain, Adult Acute back pain is sudden and usually short-lived. It is often caused by an injury to the muscles and tissues in the back. The injury may result from:  A muscle or ligament getting overstretched or torn (strained). Ligaments are tissues that connect bones to each other. Lifting something improperly can cause a back strain.  Wear and tear (degeneration) of the spinal disks. Spinal disks are circular tissue that provides cushioning between the bones of the spine (vertebrae).  Twisting motions, such as while playing sports or doing yard work.  A hit to the back.  Arthritis. You may have a physical exam, lab tests, and imaging tests to find the cause of your pain. Acute back pain usually goes away with rest and home care. Follow these instructions at home: Managing pain, stiffness, and swelling  Take over-the-counter and prescription medicines only as told by your health care provider.  Your health care provider may recommend applying ice during the first 24-48 hours after your pain starts. To do this: ? Put ice in a plastic bag. ? Place a towel between your skin and the bag. ? Leave the ice on for 20 minutes, 2-3 times a day.  If directed, apply heat to the affected area as often as told by your health care provider. Use the heat source that your health care provider recommends, such as a moist heat pack or a heating pad. ? Place a towel between your skin and the heat source. ? Leave the heat on for 20-30 minutes. ? Remove the heat if your skin turns bright red. This is especially important if you are unable to feel pain, heat, or cold. You have a greater risk of getting burned. Activity   Do not stay in bed. Staying in bed for more than 1-2 days can delay your recovery.  Sit up and stand up straight. Avoid leaning forward when you sit, or hunching over when you stand. ? If you work at a desk, sit close to it so you do not need to lean over. Keep your chin tucked  in. Keep your neck drawn back, and keep your elbows bent at a right angle. Your arms should look like the letter "L." ? Sit high and close to the steering wheel when you drive. Add lower back (lumbar) support to your car seat, if needed.  Take short walks on even surfaces as soon as you are able. Try to increase the length of time you walk each day.  Do not sit, drive, or stand in one place for more than 30 minutes at a time. Sitting or standing for long periods of time can put stress on your back.  Do not drive or use heavy machinery while taking prescription pain medicine.  Use proper lifting techniques. When you bend and lift, use positions that put less stress on your back: ? Bend your knees. ? Keep the load close to your body. ? Avoid twisting.  Exercise regularly as told by your health care provider. Exercising helps your back heal faster and helps prevent back injuries by keeping muscles strong and flexible.  Work with a physical therapist to make a safe exercise program, as recommended by your health care provider. Do any exercises as told by your physical therapist. Lifestyle  Maintain a healthy weight. Extra weight puts stress on your back and makes it difficult to have good posture.  Avoid activities or situations that make you feel anxious or stressed. Stress and anxiety increase muscle   tension and can make back pain worse. Learn ways to manage anxiety and stress, such as through exercise. General instructions  Sleep on a firm mattress in a comfortable position. Try lying on your side with your knees slightly bent. If you lie on your back, put a pillow under your knees.  Follow your treatment plan as told by your health care provider. This may include: ? Cognitive or behavioral therapy. ? Acupuncture or massage therapy. ? Meditation or yoga. Contact a health care provider if:  You have pain that is not relieved with rest or medicine.  You have increasing pain going down  into your legs or buttocks.  Your pain does not improve after 2 weeks.  You have pain at night.  You lose weight without trying.  You have a fever or chills. Get help right away if:  You develop new bowel or bladder control problems.  You have unusual weakness or numbness in your arms or legs.  You develop nausea or vomiting.  You develop abdominal pain.  You feel faint. Summary  Acute back pain is sudden and usually short-lived.  Use proper lifting techniques. When you bend and lift, use positions that put less stress on your back.  Take over-the-counter and prescription medicines and apply heat or ice as directed by your health care provider. This information is not intended to replace advice given to you by your health care provider. Make sure you discuss any questions you have with your health care provider. Document Released: 03/03/2005 Document Revised: 06/22/2018 Document Reviewed: 10/15/2016 Elsevier Patient Education  2020 [employer]  

## 2018-12-27 NOTE — Progress Notes (Signed)
Subjective:     Patient ID: Anna Powell , female    DOB: 27-Mar-1970 , 48 y.o.   MRN: [id number]   Chief Complaint  Patient presents with  . Back Pain    HPI  Back Pain This is a new problem. The current episode started yesterday. The problem occurs constantly. The pain is present in the lumbar spine. The quality of the pain is described as shooting. The pain is at a severity of 4/10. The pain is moderate. The symptoms are aggravated by sitting. She has tried analgesics for the symptoms. The treatment provided mild relief.     Past Medical History:  Diagnosis Date  . Bacterial vaginosis   . Breast density 2008   right breast   . Constipation   . Gestational thrombocytopenia (HCC)   . ITP (idiopathic thrombocytopenic purpura)      Family History  Problem Relation Age of Onset  . Hypertension Maternal Grandmother   . Cancer Paternal Grandfather        lung  . Heart disease Other        maternal great-grandmother heart attack   . Hypertension Mother   . Non-Hodgkin's lymphoma Father      Current Outpatient Medications:  .  lubiprostone (AMITIZA) 24 MCG capsule, Take 1 capsule (24 mcg total) by mouth 2 (two) times daily with a meal., Disp: 60 capsule, Rfl: 5 .  meloxicam (MOBIC) 15 MG tablet, TAKE 1 TABLET BY MOUTH DAILY AS NEEDED, Disp: 90 tablet, Rfl: 0 .  benzonatate (TESSALON PERLES) 100 MG capsule, Take 1 capsule (100 mg total) by mouth every 6 (six) hours as needed for cough. (Patient not taking: Reported on 12/27/2018), Disp: 30 capsule, Rfl: 1 .  HYDROcodone-homatropine (HYDROMET) 5-1.5 MG/5ML syrup, Take 5 mLs by mouth every 6 (six) hours as needed for cough. (Patient not taking: Reported on 12/27/2018), Disp: 120 mL, Rfl: 0   Allergies  Allergen Reactions  . Monistat [Miconazole]      Review of Systems  Constitutional: Negative.   Respiratory: Negative.   Cardiovascular: Negative.   Gastrointestinal: Negative.   Musculoskeletal: Positive for back  pain.  Neurological: Negative.   Psychiatric/Behavioral: Negative.      Today's Vitals   12/27/18 0922  BP: 110/68  Pulse: 63  Temp: 98.7 F (37.1 C)  TempSrc: Oral  SpO2: 97%  Weight: 138 lb 6.4 oz (62.8 kg)  Height: 5' 2.8" (1.595 m)   Body mass index is 24.67 kg/m.   Objective:  Physical Exam Vitals signs and nursing note reviewed.  Constitutional:      Appearance: Normal appearance.  HENT:     Head: Normocephalic and atraumatic.  Pulmonary:     Effort: Pulmonary effort is normal.     Breath sounds: Normal breath sounds.  Musculoskeletal:     Lumbar back: She exhibits tenderness and spasm.     Comments: Neg straight leg test b/l  Skin:    General: Skin is warm.  Neurological:     General: No focal deficit present.     Mental Status: She is alert.  Psychiatric:        Mood and Affect: Mood normal.        Behavior: Behavior normal.         Assessment And Plan:     1. Acute low back pain without sciatica, unspecified back pain laterality  She was given Toradol, 60mg  IM x 1. She will continue with meloxicam daily prn and resume use of cyclobenzaprine  nightly as needed. She is encouraged to seek chiropractic adjustment should her sx persist. She may also benefit from magnesium supplementation.   - ketorolac (TORADOL) injection 60 mg   2. Muscle spasm  Intermittent. She will likely benefit from magnesium supplementation.     [name], MD    THE PATIENT IS ENCOURAGED TO PRACTICE SOCIAL DISTANCING DUE TO THE COVID-19 PANDEMIC.

## 2019-02-07 ENCOUNTER — Ambulatory Visit: Payer: Federal, State, Local not specified - PPO

## 2019-02-07 ENCOUNTER — Other Ambulatory Visit: Payer: Self-pay

## 2019-02-07 DIAGNOSIS — Z23 Encounter for immunization: Secondary | ICD-10-CM

## 2019-02-09 LAB — NOVEL CORONAVIRUS, NAA: SARS-CoV-2, NAA: NOT DETECTED

## 2019-02-16 ENCOUNTER — Other Ambulatory Visit: Payer: Self-pay

## 2019-03-29 ENCOUNTER — Ambulatory Visit (INDEPENDENT_AMBULATORY_CARE_PROVIDER_SITE_OTHER): Payer: Federal, State, Local not specified - PPO | Admitting: Internal Medicine

## 2019-03-29 ENCOUNTER — Encounter: Payer: Self-pay | Admitting: Internal Medicine

## 2019-03-29 ENCOUNTER — Other Ambulatory Visit: Payer: Self-pay

## 2019-03-29 VITALS — BP 110/70 | HR 62 | Temp 98.4°F | Ht 62.8 in | Wt 137.0 lb

## 2019-03-29 DIAGNOSIS — Z1211 Encounter for screening for malignant neoplasm of colon: Secondary | ICD-10-CM

## 2019-03-29 DIAGNOSIS — Z23 Encounter for immunization: Secondary | ICD-10-CM | POA: Diagnosis not present

## 2019-03-29 DIAGNOSIS — Z79899 Other long term (current) drug therapy: Secondary | ICD-10-CM | POA: Diagnosis not present

## 2019-03-29 DIAGNOSIS — K5900 Constipation, unspecified: Secondary | ICD-10-CM | POA: Diagnosis not present

## 2019-03-29 DIAGNOSIS — K5909 Other constipation: Secondary | ICD-10-CM | POA: Diagnosis not present

## 2019-03-29 NOTE — Patient Instructions (Signed)

## 2019-03-29 NOTE — Progress Notes (Signed)
This visit occurred during the SARS-CoV-2 public health emergency.  Safety protocols were in place, including screening questions prior to the visit, additional usage of staff PPE, and extensive cleaning of exam room while observing appropriate contact time as indicated for disinfecting solutions.  Subjective:     Patient ID: Anna Powell , female    DOB: [birth year] , 49 y.o.   MRN: [id number]   Chief Complaint  Patient presents with  . Constipation    HPI  She is here today for evaluation of chronic constipation. She reports her sx seemed to have improved when she switched to a pescatarian diet. However, her sx have returned. She recently competed in a fitness competition and lost about 24 pounds. She did add chicken to her diet at that time as suggested by her trainer, and her weight loss stopped. She also had return of her abdominal discomfort. She noticed that her bowels became more sluggish at that time as well. She has been taking Amitiza once daily with some improvement in her sx. She admits to drinking about 48 ounces of water per day. She has tried Linzess in the past, but does not think this is as effective as Amitiza.   Constipation This is a chronic problem. The current episode started more than 1 year ago. The problem has been gradually worsening since onset. Her stool frequency is 2 to 3 times per week. The stool is described as firm. The patient is not on a high fiber diet. She exercises regularly. There has not been adequate water intake. Associated symptoms include back pain and bloating. Pertinent negatives include no hematochezia or rectal pain. She has tried stool softeners, fiber and diet changes for the symptoms. The treatment provided mild relief. There is no history of endocrine disease, inflammatory bowel disease, neuromuscular disease or psychiatric history.     Past Medical History:  Diagnosis Date  . Bacterial vaginosis   . Breast density 2008   right  breast   . Constipation   . Gestational thrombocytopenia ([place])   . ITP (idiopathic thrombocytopenic purpura)      Family History  Problem Relation Age of Onset  . Hypertension Maternal Grandmother   . Cancer Paternal Grandfather        lung  . Heart disease Other        maternal great-grandmother heart attack   . Hypertension Mother   . Non-Hodgkin's lymphoma Father      Current Outpatient Medications:  .  benzonatate (TESSALON PERLES) 100 MG capsule, Take 1 capsule (100 mg total) by mouth every 6 (six) hours as needed for cough. (Patient not taking: Reported on 12/27/2018), Disp: 30 capsule, Rfl: 1 .  HYDROcodone-homatropine (HYDROMET) 5-1.5 MG/5ML syrup, Take 5 mLs by mouth every 6 (six) hours as needed for cough. (Patient not taking: Reported on 12/27/2018), Disp: 120 mL, Rfl: 0 .  lubiprostone (AMITIZA) 24 MCG capsule, Take 1 capsule (24 mcg total) by mouth 2 (two) times daily with a meal., Disp: 60 capsule, Rfl: 5 .  meloxicam (MOBIC) 15 MG tablet, TAKE 1 TABLET BY MOUTH DAILY AS NEEDED, Disp: 90 tablet, Rfl: 0   Allergies  Allergen Reactions  . Monistat [Miconazole]      Review of Systems  Constitutional: Negative.   Respiratory: Negative.   Cardiovascular: Negative.   Gastrointestinal: Positive for bloating and constipation. Negative for hematochezia and rectal pain.  Musculoskeletal: Positive for back pain.  Neurological: Negative.   Psychiatric/Behavioral: Negative.      Today's Vitals  03/29/19 1122  BP: 110/70  Pulse: 62  Temp: 98.4 F (36.9 C)  TempSrc: Oral  Weight: 137 lb (62.1 kg)  Height: 5' 2.8" (1.595 m)   Body mass index is 24.42 kg/m.   Objective:  Physical Exam Vitals and nursing note reviewed.  Constitutional:      Appearance: Normal appearance.  HENT:     Head: Normocephalic and atraumatic.  Cardiovascular:     Rate and Rhythm: Normal rate and regular rhythm.     Heart sounds: Normal heart sounds.  Pulmonary:     Effort: Pulmonary  effort is normal.     Breath sounds: Normal breath sounds.  Abdominal:     General: Abdomen is flat. Bowel sounds are normal. There is no distension.     Palpations: Abdomen is soft.     Tenderness: There is no abdominal tenderness.     Hernia: No hernia is present.  Skin:    General: Skin is warm.  Neurological:     General: No focal deficit present.     Mental Status: She is alert.  Psychiatric:        Mood and Affect: Mood normal.        Behavior: Behavior normal.         Assessment And Plan:     1. Chronic constipation  I will check labs as listed below. I will also refer her to GI for CRC screening. We discussed importance of increasing both her fiber and her water intake to help with bowel motility. Also advised that Amitiza is usually dosed twice daily - she agrees to take twice daily at least three days per week, and once daily on other days.   - Gluten Sensitivity Screen - Ambulatory referral to [facility] - TSH - CMP14+EGFR - CBC no Diff - Allergen Profile, Food-Meat  2. Screen for colon cancer  I will refer her to GI for CRC screening.   - Ambulatory referral to [facility]    3. Immunization due  She is due for Tdap; however, she is scheduled for her first [place] vaccination this Friday, January 15th. Pt is advised to wait until one month after her SECOND COVID vaccine before getting this immunization. She is in agreement with her treatment plan.      [name], MD    THE PATIENT IS ENCOURAGED TO PRACTICE SOCIAL DISTANCING DUE TO THE COVID-19 PANDEMIC.

## 2019-04-01 LAB — CMP14+EGFR
ALT: 106 IU/L — ABNORMAL HIGH (ref 0–32)
AST: 67 IU/L — ABNORMAL HIGH (ref 0–40)
Albumin/Globulin Ratio: 1.4 (ref 1.2–2.2)
Albumin: 4 g/dL (ref 3.8–4.8)
Alkaline Phosphatase: 70 IU/L (ref 39–117)
BUN/Creatinine Ratio: 17 (ref 9–23)
BUN: 16 mg/dL (ref 6–24)
Bilirubin Total: 0.4 mg/dL (ref 0.0–1.2)
CO2: 23 mmol/L (ref 20–29)
Calcium: 9.5 mg/dL (ref 8.7–10.2)
Chloride: 101 mmol/L (ref 96–106)
Creatinine, Ser: 0.94 mg/dL (ref 0.57–1.00)
GFR calc Af Amer: 82 mL/min/{1.73_m2} (ref >59)
GFR calc non Af Amer: 71 mL/min/{1.73_m2} (ref >59)
Globulin, Total: 2.8 g/dL (ref 1.5–4.5)
Glucose: 76 mg/dL (ref 65–99)
Potassium: 4.1 mmol/L (ref 3.5–5.2)
Sodium: 137 mmol/L (ref 134–144)
Total Protein: 6.8 g/dL (ref 6.0–8.5)

## 2019-04-01 LAB — TSH: TSH: 1.38 u[IU]/mL (ref 0.450–4.500)

## 2019-04-01 LAB — ANTIGLIADIN IGG (NATIVE): Antigliadin IgG (native): 11 units (ref 0–19)

## 2019-04-01 LAB — NOTE:

## 2019-04-01 LAB — ALLERGEN PROFILE, FOOD-MEAT
Beef IgE: <0.1 kU/L
Chicken IgE: <0.1 kU/L
Pork IgE: <0.1 kU/L

## 2019-04-01 LAB — CBC
Hematocrit: 38.7 % (ref 34.0–46.6)
Hemoglobin: 11.8 g/dL (ref 11.1–15.9)
MCH: 27.3 pg (ref 26.6–33.0)
MCHC: 30.5 g/dL — ABNORMAL LOW (ref 31.5–35.7)
MCV: 90 fL (ref 79–97)
RBC: 4.32 x10E6/uL (ref 3.77–5.28)
RDW: 13 % (ref 11.7–15.4)
WBC: 4.9 10*3/uL (ref 3.4–10.8)

## 2019-04-01 LAB — F004-IGE WHEAT: Wheat IgE: <0.1 kU/L

## 2019-04-01 LAB — GLUTEN SENSITIVITY SCREEN: tTG/DGP Screen: NEGATIVE

## 2019-04-21 DIAGNOSIS — R140 Abdominal distension (gaseous): Secondary | ICD-10-CM | POA: Diagnosis not present

## 2019-04-21 DIAGNOSIS — K5904 Chronic idiopathic constipation: Secondary | ICD-10-CM | POA: Diagnosis not present

## 2019-04-21 DIAGNOSIS — Z1211 Encounter for screening for malignant neoplasm of colon: Secondary | ICD-10-CM | POA: Diagnosis not present

## 2019-04-21 DIAGNOSIS — R748 Abnormal levels of other serum enzymes: Secondary | ICD-10-CM | POA: Diagnosis not present

## 2019-05-10 ENCOUNTER — Ambulatory Visit (INDEPENDENT_AMBULATORY_CARE_PROVIDER_SITE_OTHER): Payer: Federal, State, Local not specified - PPO | Admitting: Internal Medicine

## 2019-05-10 ENCOUNTER — Encounter: Payer: Self-pay | Admitting: Internal Medicine

## 2019-05-10 ENCOUNTER — Other Ambulatory Visit: Payer: Self-pay

## 2019-05-10 VITALS — BP 100/64 | HR 74 | Temp 98.3°F | Ht 62.8 in | Wt 134.4 lb

## 2019-05-10 DIAGNOSIS — R748 Abnormal levels of other serum enzymes: Secondary | ICD-10-CM

## 2019-05-10 DIAGNOSIS — K5909 Other constipation: Secondary | ICD-10-CM

## 2019-05-10 DIAGNOSIS — Z87898 Personal history of other specified conditions: Secondary | ICD-10-CM

## 2019-05-10 NOTE — Progress Notes (Signed)
  This visit occurred during the SARS-CoV-2 public health emergency.  Safety protocols were in place, including screening questions prior to the visit, additional usage of staff PPE, and extensive cleaning of exam room while observing appropriate contact time as indicated for disinfecting solutions.  Subjective:     Patient ID: Anna Powell , female    DOB: 1970-10-18 , 49 y.o.   MRN: [id number]   Chief Complaint  Patient presents with  . f/u on Liver enzymes    HPI  She presents today for f/u elevated liver enzymes.  She has further evaluation by GI - she reports she is negative for hepatitis and her enzymes have since normalized. It is thought that elevation could have been due to natural supplements that she was taking.   She is still having issues with constipation. Amitiza is no longer effective and she did not have relief with Linzess. GI has now started her on Trulance. She has not had any issues thus far. She has had a slight improvement in her sx.     Past Medical History:  Diagnosis Date  . Bacterial vaginosis   . Breast density 2008   right breast   . Constipation   . Gestational thrombocytopenia (HCC)   . ITP (idiopathic thrombocytopenic purpura)      Family History  Problem Relation Age of Onset  . Hypertension Maternal Grandmother   . Cancer Paternal Grandfather        lung  . Heart disease Other        maternal great-grandmother heart attack   . Hypertension Mother   . Non-Hodgkin's lymphoma Father      Current Outpatient Medications:  .  Plecanatide (TRULANCE PO), Take by mouth., Disp: , Rfl:    Allergies  Allergen Reactions  . Monistat [Miconazole]      Review of Systems  Constitutional: Negative.   Respiratory: Negative.   Cardiovascular: Negative.   Gastrointestinal: Positive for constipation.  Neurological: Negative.   Psychiatric/Behavioral: Negative.      Today's Vitals   05/10/19 1134  BP: 100/64  Pulse: 74  Temp: 98.3 F  (36.8 C)  TempSrc: Oral  Weight: 134 lb 6.4 oz (61 kg)  Height: 5' 2.8" (1.595 m)   Body mass index is 23.96 kg/m.   Objective:  Physical Exam Vitals and nursing note reviewed.  Constitutional:      Appearance: Normal appearance.  HENT:     Head: Normocephalic and atraumatic.  Cardiovascular:     Rate and Rhythm: Normal rate and regular rhythm.     Heart sounds: Normal heart sounds.  Pulmonary:     Effort: Pulmonary effort is normal.     Breath sounds: Normal breath sounds.  Skin:    General: Skin is warm.  Neurological:     General: No focal deficit present.     Mental Status: She is alert.  Psychiatric:        Mood and Affect: Mood normal.        Behavior: Behavior normal.         Assessment And Plan:     1. Elevated liver enzymes  Resolved. She is encouraged to avoid over-supplementation with "natural" compounds which may cause liver enzyme elevation.   2. Chronic constipation  She will continue with Trulance for now. She is also encouraged to stay well hydrated.    Gwynneth Aliment, MD    THE PATIENT IS ENCOURAGED TO PRACTICE SOCIAL DISTANCING DUE TO THE COVID-19 PANDEMIC.

## 2019-05-27 DIAGNOSIS — Z1211 Encounter for screening for malignant neoplasm of colon: Secondary | ICD-10-CM | POA: Diagnosis not present

## 2019-05-27 LAB — HM COLONOSCOPY

## 2019-05-30 ENCOUNTER — Encounter: Payer: Self-pay | Admitting: Internal Medicine

## 2019-07-13 ENCOUNTER — Other Ambulatory Visit: Payer: Self-pay | Admitting: Internal Medicine

## 2019-07-13 DIAGNOSIS — Z1231 Encounter for screening mammogram for malignant neoplasm of breast: Secondary | ICD-10-CM

## 2019-08-23 ENCOUNTER — Encounter: Payer: Self-pay | Admitting: Internal Medicine

## 2019-08-23 ENCOUNTER — Other Ambulatory Visit: Payer: Self-pay

## 2019-08-23 ENCOUNTER — Ambulatory Visit (INDEPENDENT_AMBULATORY_CARE_PROVIDER_SITE_OTHER): Payer: Federal, State, Local not specified - PPO | Admitting: Internal Medicine

## 2019-08-23 VITALS — BP 100/58 | HR 70 | Temp 98.5°F | Ht 62.8 in | Wt 133.0 lb

## 2019-08-23 DIAGNOSIS — Z13220 Encounter for screening for lipoid disorders: Secondary | ICD-10-CM | POA: Diagnosis not present

## 2019-08-23 DIAGNOSIS — Z23 Encounter for immunization: Secondary | ICD-10-CM

## 2019-08-23 DIAGNOSIS — Z0000 Encounter for general adult medical examination without abnormal findings: Secondary | ICD-10-CM | POA: Diagnosis not present

## 2019-08-23 DIAGNOSIS — R799 Abnormal finding of blood chemistry, unspecified: Secondary | ICD-10-CM | POA: Diagnosis not present

## 2019-08-23 DIAGNOSIS — H6123 Impacted cerumen, bilateral: Secondary | ICD-10-CM

## 2019-08-23 MED ORDER — TETANUS-DIPHTH-ACELL PERTUSSIS 5-2.5-18.5 LF-MCG/0.5 IM SUSP
0.5000 mL | Freq: Once | INTRAMUSCULAR | Status: AC
  Administered 2019-08-23: 0.5 mL via INTRAMUSCULAR

## 2019-08-23 NOTE — Patient Instructions (Signed)
Health Maintenance, Female Adopting a healthy lifestyle and getting preventive care are important in promoting health and wellness. Ask your health care provider about:  The right schedule for you to have regular tests and exams.  Things you can do on your own to prevent diseases and keep yourself healthy. What should I know about diet, weight, and exercise? Eat a healthy diet   Eat a diet that includes plenty of vegetables, fruits, low-fat dairy products, and lean protein.  Do not eat a lot of foods that are high in solid fats, added sugars, or sodium. Maintain a healthy weight Body mass index (BMI) is used to identify weight problems. It estimates body fat based on height and weight. Your health care provider can help determine your BMI and help you achieve or maintain a healthy weight. Get regular exercise Get regular exercise. This is one of the most important things you can do for your health. Most adults should:  Exercise for at least 150 minutes each week. The exercise should increase your heart rate and make you sweat (moderate-intensity exercise).  Do strengthening exercises at least twice a week. This is in addition to the moderate-intensity exercise.  Spend less time sitting. Even light physical activity can be beneficial. Watch cholesterol and blood lipids Have your blood tested for lipids and cholesterol at 51 years of age, then have this test every 5 years. Have your cholesterol levels checked more often if:  Your lipid or cholesterol levels are high.  You are older than 51 years of age.  You are at high risk for heart disease. What should I know about cancer screening? Depending on your health history and family history, you may need to have cancer screening at various ages. This may include screening for:  Breast cancer.  Cervical cancer.  Colorectal cancer.  Skin cancer.  Lung cancer. What should I know about heart disease, diabetes, and high blood  pressure? Blood pressure and heart disease  High blood pressure causes heart disease and increases the risk of stroke. This is more likely to develop in people who have high blood pressure readings, are of African descent, or are overweight.  Have your blood pressure checked: ? Every 3-5 years if you are 18-39 years of age. ? Every year if you are 40 years old or older. Diabetes Have regular diabetes screenings. This checks your fasting blood sugar level. Have the screening done:  Once every three years after age 40 if you are at a normal weight and have a low risk for diabetes.  More often and at a younger age if you are overweight or have a high risk for diabetes. What should I know about preventing infection? Hepatitis B If you have a higher risk for hepatitis B, you should be screened for this virus. Talk with your health care provider to find out if you are at risk for hepatitis B infection. Hepatitis C Testing is recommended for:  Everyone born from 1945 through 1965.  Anyone with known risk factors for hepatitis C. Sexually transmitted infections (STIs)  Get screened for STIs, including gonorrhea and chlamydia, if: ? You are sexually active and are younger than 51 years of age. ? You are older than 51 years of age and your health care provider tells you that you are at risk for this type of infection. ? Your sexual activity has changed since you were last screened, and you are at increased risk for chlamydia or gonorrhea. Ask your health care provider if   you are at risk.  Ask your health care provider about whether you are at high risk for HIV. Your health care provider may recommend a prescription medicine to help prevent HIV infection. If you choose to take medicine to prevent HIV, you should first get tested for HIV. You should then be tested every 3 months for as long as you are taking the medicine. Pregnancy  If you are about to stop having your period (premenopausal) and  you may become pregnant, seek counseling before you get pregnant.  Take 400 to 800 micrograms (mcg) of folic acid every day if you become pregnant.  Ask for birth control (contraception) if you want to prevent pregnancy. Osteoporosis and menopause Osteoporosis is a disease in which the bones lose minerals and strength with aging. This can result in bone fractures. If you are 65 years old or older, or if you are at risk for osteoporosis and fractures, ask your health care provider if you should:  Be screened for bone loss.  Take a calcium or vitamin D supplement to lower your risk of fractures.  Be given hormone replacement therapy (HRT) to treat symptoms of menopause. Follow these instructions at home: Lifestyle  Do not use any products that contain nicotine or tobacco, such as cigarettes, e-cigarettes, and chewing tobacco. If you need help quitting, ask your health care provider.  Do not use street drugs.  Do not share needles.  Ask your health care provider for help if you need support or information about quitting drugs. Alcohol use  Do not drink alcohol if: ? Your health care provider tells you not to drink. ? You are pregnant, may be pregnant, or are planning to become pregnant.  If you drink alcohol: ? Limit how much you use to 0-1 drink a day. ? Limit intake if you are breastfeeding.  Be aware of how much alcohol is in your drink. In the [place] one drink equals one 12 oz bottle of beer (355 mL), one 5 oz glass of wine (148 mL), or one 1 oz glass of hard liquor (44 mL). General instructions  Schedule regular health, dental, and eye exams.  Stay current with your vaccines.  Tell your health care provider if: ? You often feel depressed. ? You have ever been abused or do not feel safe at home. Summary  Adopting a healthy lifestyle and getting preventive care are important in promoting health and wellness.  Follow your health care provider's instructions about healthy  diet, exercising, and getting tested or screened for diseases.  Follow your health care provider's instructions on monitoring your cholesterol and blood pressure. This information is not intended to replace advice given to you by your health care provider. Make sure you discuss any questions you have with your health care provider. Document Revised: 02/24/2018 Document Reviewed: 02/24/2018 Elsevier Patient Education  2020 [employer]  

## 2019-08-24 LAB — CMP14+EGFR
ALT: 17 IU/L (ref 0–32)
AST: 21 IU/L (ref 0–40)
Albumin/Globulin Ratio: 1.8 (ref 1.2–2.2)
Albumin: 4.1 g/dL (ref 3.8–4.8)
Alkaline Phosphatase: 68 IU/L (ref 48–121)
BUN/Creatinine Ratio: 17 (ref 9–23)
BUN: 21 mg/dL (ref 6–24)
Bilirubin Total: 0.3 mg/dL (ref 0.0–1.2)
CO2: 24 mmol/L (ref 20–29)
Calcium: 9.2 mg/dL (ref 8.7–10.2)
Chloride: 105 mmol/L (ref 96–106)
Creatinine, Ser: 1.21 mg/dL — ABNORMAL HIGH (ref 0.57–1.00)
GFR calc Af Amer: 60 mL/min/{1.73_m2} (ref >59)
GFR calc non Af Amer: 52 mL/min/{1.73_m2} — ABNORMAL LOW (ref >59)
Globulin, Total: 2.3 g/dL (ref 1.5–4.5)
Glucose: 87 mg/dL (ref 65–99)
Potassium: 4.6 mmol/L (ref 3.5–5.2)
Sodium: 138 mmol/L (ref 134–144)
Total Protein: 6.4 g/dL (ref 6.0–8.5)

## 2019-08-24 LAB — LIPID PANEL
Chol/HDL Ratio: 1.8 ratio (ref 0.0–4.4)
Cholesterol, Total: 121 mg/dL (ref 100–199)
HDL: 66 mg/dL (ref >39)
LDL Chol Calc (NIH): 44 mg/dL (ref 0–99)
Triglycerides: 47 mg/dL (ref 0–149)
VLDL Cholesterol Cal: 11 mg/dL (ref 5–40)

## 2019-08-26 NOTE — Progress Notes (Signed)
This visit occurred during the SARS-CoV-2 public health emergency.  Safety protocols were in place, including screening questions prior to the visit, additional usage of staff PPE, and extensive cleaning of exam room while observing appropriate contact time as indicated for disinfecting solutions.  Subjective:     Patient ID: Anna Powell , female    DOB: 14-Sep-1970 , 49 y.o.   MRN: [id number]   Chief Complaint  Patient presents with  . Annual Exam    HPI  She is here today for a full physical exam. She is followed by Dr. Charlesetta Garibaldi for her Gyn care.  She denies having any specific concerns or complaints at this time.     Past Medical History:  Diagnosis Date  . Bacterial vaginosis   . Breast density 2008   right breast   . Constipation   . Gestational thrombocytopenia ([place])   . ITP (idiopathic thrombocytopenic purpura)      Family History  Problem Relation Age of Onset  . Hypertension Maternal Grandmother   . Cancer Paternal Grandfather        lung  . Heart disease Other        maternal great-grandmother heart attack   . Hypertension Mother   . Non-Hodgkin's lymphoma Father      Current Outpatient Medications:  .  levocetirizine (XYZAL) 5 MG tablet, levocetirizine 5 mg tablet, Disp: , Rfl:  .  Plecanatide (TRULANCE PO), Take by mouth., Disp: , Rfl:    Allergies  Allergen Reactions  . Monistat [Miconazole]      The patient states she uses none for birth control. Last LMP was Patient's last menstrual period was 08/21/2019.. Negative for Dysmenorrhea Negative for: breast discharge, breast lump(s), breast pain and breast self exam. Associated symptoms include abnormal vaginal bleeding. Pertinent negatives include abnormal bleeding (hematology), anxiety, decreased libido, depression, difficulty falling sleep, dyspareunia, history of infertility, nocturia, sexual dysfunction, sleep disturbances, urinary incontinence, urinary urgency, vaginal discharge and vaginal  itching. Diet regular.The patient states her exercise level is  strenuous, she now participates in fitness competitions. She is excited to report she placed 2nd in her division last month!  . The patient's tobacco use is:  Social History   Tobacco Use  Smoking Status Never Smoker  Smokeless Tobacco Never Used  . She has been exposed to passive smoke. The patient's alcohol use is:  Social History   Substance and Sexual Activity  Alcohol Use No    Review of Systems  Constitutional: Negative.   HENT: Negative.        She c/o r ear discomfort. She denies hearing loss.   Eyes: Negative.   Respiratory: Negative.   Cardiovascular: Negative.   Endocrine: Negative.   Genitourinary: Negative.   Musculoskeletal: Negative.   Skin: Negative.   Allergic/Immunologic: Negative.   Neurological: Negative.   Hematological: Negative.   Psychiatric/Behavioral: Negative.      Today's Vitals   08/23/19 0911  BP: (!) 100/58  Pulse: 70  Temp: 98.5 F (36.9 C)  TempSrc: Oral  Weight: 133 lb (60.3 kg)  Height: 5' 2.8" (1.595 m)   Body mass index is 23.71 kg/m.   Objective:  Physical Exam Vitals and nursing note reviewed.  Constitutional:      Appearance: Normal appearance.  HENT:     Head: Normocephalic and atraumatic.     Right Ear: Ear canal and external ear normal. There is impacted cerumen.     Left Ear: Ear canal and external ear normal. There is impacted  cerumen.     Nose:     Comments: Deferred, masked    Mouth/Throat:     Comments: Deferred, masked Eyes:     Extraocular Movements: Extraocular movements intact.     Conjunctiva/sclera: Conjunctivae normal.     Pupils: Pupils are equal, round, and reactive to light.  Cardiovascular:     Rate and Rhythm: Normal rate and regular rhythm.     Pulses: Normal pulses.     Heart sounds: Normal heart sounds.  Pulmonary:     Effort: Pulmonary effort is normal.     Breath sounds: Normal breath sounds.  Abdominal:     General:  Abdomen is flat. Bowel sounds are normal.     Palpations: Abdomen is soft.  Genitourinary:    Comments: deferred Musculoskeletal:        General: Normal range of motion.     Cervical back: Normal range of motion and neck supple.  Skin:    General: Skin is warm and dry.  Neurological:     General: No focal deficit present.     Mental Status: She is alert and oriented to person, place, and time.  Psychiatric:        Mood and Affect: Mood normal.        Behavior: Behavior normal.         Assessment And Plan:     1. Routine general medical examination at health care facility  A full exam was performed.  Importance of monthly self breast exams was discussed with the patient. PATIENT IS ADVISED TO GET 30-45 MINUTES REGULAR EXERCISE NO LESS THAN FOUR TO FIVE DAYS PER WEEK - BOTH WEIGHTBEARING EXERCISES AND AEROBIC ARE RECOMMENDED.  SHE IS ADVISED TO FOLLOW A HEALTHY DIET WITH AT LEAST SIX FRUITS/VEGGIES PER DAY, DECREASE INTAKE OF RED MEAT, AND TO INCREASE FISH INTAKE TO TWO DAYS PER WEEK.  MEATS/FISH SHOULD NOT BE FRIED, BAKED OR BROILED IS PREFERABLE.  I SUGGEST WEARING SPF 50 SUNSCREEN ON EXPOSED PARTS AND ESPECIALLY WHEN IN THE DIRECT SUNLIGHT FOR AN EXTENDED PERIOD OF TIME.  PLEASE AVOID FAST FOOD RESTAURANTS AND INCREASE YOUR WATER INTAKE.  - CMP14+EGFR - Lipid panel  2. Bilateral impacted cerumen  AFTER OBTAINING VERBAL CONSENT, BOTH EARS WERE FLUSHED BY IRRIGATION. SHE TOLERATED PROCEDURE WELL WITHOUT ANY COMPLICATIONS. NO TM ABNORMALITIES WERE NOTED.  - Ear Lavage  3. Immunization due  - Tdap (BOOSTRIX) injection 0.5 mL        [name], MD    THE PATIENT IS ENCOURAGED TO PRACTICE SOCIAL DISTANCING DUE TO THE COVID-19 PANDEMIC.

## 2019-09-06 ENCOUNTER — Other Ambulatory Visit: Payer: Self-pay

## 2019-09-06 MED ORDER — NOREL AD 4-10-325 MG PO TABS: ORAL_TABLET | ORAL | 0 refills | Status: DC

## 2019-09-06 NOTE — Telephone Encounter (Signed)
Refill of [place] AD was faxed to the pt's pharmacy.

## 2019-09-12 ENCOUNTER — Other Ambulatory Visit: Payer: Self-pay

## 2019-09-12 ENCOUNTER — Ambulatory Visit
Admission: RE | Admit: 2019-09-12 | Discharge: 2019-09-12 | Disposition: A | Discharge status: Home / Self Care | Payer: Federal, State, Local not specified - PPO | Source: Ambulatory Visit

## 2019-09-12 DIAGNOSIS — Z1231 Encounter for screening mammogram for malignant neoplasm of breast: Secondary | ICD-10-CM | POA: Diagnosis not present

## 2019-09-12 DIAGNOSIS — Z113 Encounter for screening for infections with a predominantly sexual mode of transmission: Secondary | ICD-10-CM | POA: Diagnosis not present

## 2019-09-12 DIAGNOSIS — R768 Other specified abnormal immunological findings in serum: Secondary | ICD-10-CM | POA: Diagnosis not present

## 2019-09-12 DIAGNOSIS — Z01419 Encounter for gynecological examination (general) (routine) without abnormal findings: Secondary | ICD-10-CM | POA: Diagnosis not present

## 2019-09-29 ENCOUNTER — Other Ambulatory Visit: Payer: Self-pay

## 2019-09-29 DIAGNOSIS — H9201 Otalgia, right ear: Secondary | ICD-10-CM

## 2019-10-14 ENCOUNTER — Telehealth (INDEPENDENT_AMBULATORY_CARE_PROVIDER_SITE_OTHER): Payer: Self-pay

## 2019-10-31 ENCOUNTER — Ambulatory Visit (INDEPENDENT_AMBULATORY_CARE_PROVIDER_SITE_OTHER): Payer: Federal, State, Local not specified - PPO | Admitting: Otolaryngology

## 2019-10-31 ENCOUNTER — Other Ambulatory Visit: Payer: Self-pay

## 2019-10-31 ENCOUNTER — Encounter (INDEPENDENT_AMBULATORY_CARE_PROVIDER_SITE_OTHER): Payer: Self-pay | Admitting: Otolaryngology

## 2019-10-31 VITALS — Temp 97.3°F

## 2019-10-31 DIAGNOSIS — H6981 Other specified disorders of Eustachian tube, right ear: Secondary | ICD-10-CM | POA: Diagnosis not present

## 2019-10-31 DIAGNOSIS — H9311 Tinnitus, right ear: Secondary | ICD-10-CM

## 2019-10-31 NOTE — Progress Notes (Signed)
HPI: Anna Powell is a 49 y.o. female who presents is referred by Dr. Allyne Gee for evaluation of intermittent right ear "clicking" over the past 4 months.  This sometimes wakes her up at night.  It tends to come and go throughout the day.  She does notice that she is a little more stuffy at night and that she has postnasal drainage in the mornings.  She has tried Zyrtec as well as Allegra-D and uses Flonase intermittently.  She has not noted any hearing problems.  She has had no vertigo or dizziness.  She is having no ear pain or discomfort. She does not smoke and is otherwise healthy..  Past Medical History:  Diagnosis Date  . Bacterial vaginosis   . Breast density 2008   right breast   . Constipation   . Gestational thrombocytopenia (HCC)   . ITP (idiopathic thrombocytopenic purpura)    Past Surgical History:  Procedure Laterality Date  . CESAREAN SECTION    . ENDOMETRIAL BIOPSY  11/01/09  . TUBAL LIGATION     Social History   Socioeconomic History  . Marital status: Married    Spouse name: Not on file  . Number of children: Not on file  . Years of education: Not on file  . Highest education level: Not on file  Occupational History  . Not on file  Tobacco Use  . Smoking status: Never Smoker  . Smokeless tobacco: Never Used  Vaping Use  . Vaping Use: Never used  Substance and Sexual Activity  . Alcohol use: No  . Drug use: No  . Sexual activity: Yes    Birth control/protection: Surgical    Comment: BTL  Other Topics Concern  . Not on file  Social History Narrative  . Not on file   Social Determinants of Health   Financial Resource Strain:   . Difficulty of Paying Living Expenses:   Food Insecurity:   . Worried About Programme researcher, broadcasting/film/video in the Last Year:   . Barista in the Last Year:   Transportation Needs:   . Freight forwarder (Medical):   Marland Kitchen Lack of Transportation (Non-Medical):   Physical Activity:   . Days of Exercise per Week:   .  Minutes of Exercise per Session:   Stress:   . Feeling of Stress :   Social Connections:   . Frequency of Communication with Friends and Family:   . Frequency of Social Gatherings with Friends and Family:   . Attends Religious Services:   . Active Member of Clubs or Organizations:   . Attends Banker Meetings:   Marland Kitchen Marital Status:    Family History  Problem Relation Age of Onset  . Hypertension Maternal Grandmother   . Cancer Paternal Grandfather        lung  . Heart disease Other        maternal great-grandmother heart attack   . Hypertension Mother   . Non-Hodgkin's lymphoma Father    Allergies  Allergen Reactions  . Monistat [Miconazole]    Prior to Admission medications   Medication Sig Start Date End Date Taking? Authorizing Provider  levocetirizine (XYZAL) 5 MG tablet levocetirizine 5 mg tablet   Yes [provider]  NOREL AD 4-10-325 MG TABS Take 1 tablet by mouth 2 times per day 09/06/19  Yes Dorothyann Peng, MD  Plecanatide (TRULANCE PO) Take by mouth.   Yes [provider]     Positive ROS: Otherwise negative  All other systems have been reviewed and were otherwise negative with the exception of those mentioned in the HPI and as above.  Physical Exam: Constitutional: Alert, well-appearing, no acute distress Ears: External ears without lesions or tenderness.  She has a small amount of nonobstructing wax in both ear canals that was cleaned with a curette.  TMs are clear bilaterally with good mobility on pneumatic otoscopy.  No middle ear space abnormality noted.  On tuning fork testing her hearing was symmetric and she heard well in both ears although perhaps had a slight diminished hearing with a 1024 tuning fork in both ears that was symmetric.  AC > BC bilaterally and Weber midline. Nasal: External nose without lesions. Septum midline with mild rhinitis.  After decongesting the nose both middle meatus regions were clear.. Clear nasal  passages with no signs of infection. Oral: Lips and gums without lesions. Tongue and palate mucosa without lesions. Posterior oropharynx clear. Neck: No palpable adenopathy or masses Respiratory: Breathing comfortably  Skin: No facial/neck lesions or rash noted.  Procedures  Assessment: Ear clicking questionable etiology.  Normal ear examination.  Possibly related to eustachian tube dysfunction or allergies.  Plan: Reassured patient of normal examination. Recommended regular use of Flonase 2 sprays each nostril at night for the next 3 weeks.  If the clicking problems persist she will call us back to schedule audiologic testing in 3 to 4 weeks.   Narda Bonds, MD   CC:

## 2019-11-04 ENCOUNTER — Encounter: Payer: Self-pay | Admitting: Internal Medicine

## 2019-11-10 ENCOUNTER — Other Ambulatory Visit: Payer: Self-pay

## 2019-11-10 DIAGNOSIS — Z1152 Encounter for screening for COVID-19: Secondary | ICD-10-CM

## 2019-11-11 LAB — NOVEL CORONAVIRUS, NAA: SARS-CoV-2, NAA: NOT DETECTED

## 2019-11-11 LAB — SARS-COV-2, NAA 2 DAY TAT

## 2019-11-14 ENCOUNTER — Other Ambulatory Visit: Payer: Self-pay

## 2019-11-14 DIAGNOSIS — Z1152 Encounter for screening for COVID-19: Secondary | ICD-10-CM

## 2019-11-16 LAB — NOVEL CORONAVIRUS, NAA: SARS-CoV-2, NAA: NOT DETECTED

## 2019-12-16 ENCOUNTER — Ambulatory Visit: Payer: Federal, State, Local not specified - PPO

## 2020-01-13 ENCOUNTER — Ambulatory Visit: Payer: Federal, State, Local not specified - PPO

## 2020-03-07 ENCOUNTER — Other Ambulatory Visit: Payer: Self-pay

## 2020-03-07 DIAGNOSIS — R059 Cough, unspecified: Secondary | ICD-10-CM

## 2020-03-08 ENCOUNTER — Other Ambulatory Visit: Payer: Self-pay | Admitting: Nurse Practitioner

## 2020-03-08 LAB — SARS-COV-2, NAA 2 DAY TAT

## 2020-03-08 LAB — NOVEL CORONAVIRUS, NAA: SARS-CoV-2, NAA: NOT DETECTED

## 2020-03-08 MED ORDER — AZITHROMYCIN 250 MG PO TABS: ORAL_TABLET | ORAL | 0 refills | Status: AC

## 2020-03-14 ENCOUNTER — Other Ambulatory Visit: Payer: Self-pay

## 2020-03-14 DIAGNOSIS — Z20822 Contact with and (suspected) exposure to covid-19: Secondary | ICD-10-CM | POA: Diagnosis not present

## 2020-03-16 LAB — SARS-COV-2, NAA 2 DAY TAT

## 2020-03-16 LAB — NOVEL CORONAVIRUS, NAA: SARS-CoV-2, NAA: NOT DETECTED

## 2020-07-05 ENCOUNTER — Ambulatory Visit (INDEPENDENT_AMBULATORY_CARE_PROVIDER_SITE_OTHER): Payer: Federal, State, Local not specified - PPO | Admitting: Internal Medicine

## 2020-07-05 ENCOUNTER — Other Ambulatory Visit: Payer: Self-pay

## 2020-07-05 ENCOUNTER — Encounter: Payer: Self-pay | Admitting: Internal Medicine

## 2020-07-05 VITALS — BP 114/70 | HR 64 | Temp 97.8°F | Ht 62.8 in

## 2020-07-05 DIAGNOSIS — U071 COVID-19: Secondary | ICD-10-CM | POA: Diagnosis not present

## 2020-07-05 DIAGNOSIS — R0981 Nasal congestion: Secondary | ICD-10-CM

## 2020-07-05 LAB — POC COVID19 BINAXNOW: SARS Coronavirus 2 Ag: POSITIVE — AB

## 2020-07-05 MED ORDER — TRIAMCINOLONE ACETONIDE 40 MG/ML IJ SUSP
60.0000 mg | Freq: Once | INTRAMUSCULAR | Status: AC
  Administered 2020-07-05: 60 mg via INTRAMUSCULAR

## 2020-07-05 NOTE — Patient Instructions (Signed)
COVID-19 Quarantine vs. Isolation QUARANTINE keeps someone who was in close contact with someone who has COVID-19 away from others. Quarantine if you have been in close contact with someone who has COVID-19, unless you have been fully vaccinated. If you are fully vaccinated  You do NOT need to quarantine unless they have symptoms  Get tested 3-5 days after your exposure, even if you don't have symptoms  Wear a mask indoors in public for 14 days following exposure or until your test result is negative If you are not fully vaccinated  Stay home for 14 days after your last contact with a person who has COVID-19  Watch for fever (100.4F), cough, shortness of breath, or other symptoms of COVID-19  If possible, stay away from people you live with, especially people who are at higher risk for getting very sick from COVID-19  Contact your local public health department for options in your area to possibly shorten your quarantine ISOLATION keeps someone who is sick or tested positive for COVID-19 without symptoms away from others, even in their own home. People who are in isolation should stay home and stay in a specific "sick room" or area and use a separate bathroom (if available). If you are sick and think or know you have COVID-19 Stay home until after  At least 10 days since symptoms first appeared and  At least 24 hours with no fever without the use of fever-reducing medications and  Symptoms have improved If you tested positive for COVID-19 but do not have symptoms  Stay home until after 10 days have passed since your positive viral test  If you develop symptoms after testing positive, follow the steps above for those who are sick cdc.gov/coronavirus 12/12/2019 This information is not intended to replace advice given to you by your health care provider. Make sure you discuss any questions you have with your health care provider. Document Revised: 01/16/2020 Document Reviewed:  01/16/2020 Elsevier Patient Education  2021 Elsevier Inc.  

## 2020-07-05 NOTE — Progress Notes (Signed)
I,Katawbba Wiggins,acting as a Neurosurgeon for Gwynneth Aliment, MD.,have documented all relevant documentation on the behalf of Gwynneth Aliment, MD,as directed by  Gwynneth Aliment, MD while in the presence of Gwynneth Aliment, MD.  This visit occurred during the SARS-CoV-2 public health emergency.  Safety protocols were in place, including screening questions prior to the visit, additional usage of staff PPE, and extensive cleaning of exam room while observing appropriate contact time as indicated for disinfecting solutions.  Subjective:     Patient ID: Anna Powell , female    DOB: 1970/07/30 , 50 y.o.   MRN: [id number]   Chief Complaint  Patient presents with  . URI    HPI  She presents today for 2 day history of headache, cough and body aches. She thought she may have a sinus infection. Did not have any consideration for COVID. Does admit her Mom was sick about a week ago. No one is sick in her household.     Past Medical History:  Diagnosis Date  . Bacterial vaginosis   . Breast density 2008   right breast   . Constipation   . Gestational thrombocytopenia (HCC)   . ITP (idiopathic thrombocytopenic purpura)      Family History  Problem Relation Age of Onset  . Hypertension Maternal Grandmother   . Cancer Paternal Grandfather        lung  . Heart disease Other        maternal great-grandmother heart attack   . Hypertension Mother   . Non-Hodgkin's lymphoma Father      Current Outpatient Medications:  .  cetirizine-pseudoephedrine (ZYRTEC-D) 5-120 MG tablet, Take 1 tablet by mouth 2 (two) times daily., Disp: , Rfl:  .  levocetirizine (XYZAL) 5 MG tablet, levocetirizine 5 mg tablet, Disp: , Rfl:  .  linaclotide (LINZESS) 145 MCG CAPS capsule, Take 145 mcg by mouth daily before breakfast. As needed, Disp: , Rfl:  .  NOREL AD 4-10-325 MG TABS, Take 1 tablet by mouth 2 times per day, Disp: 20 tablet, Rfl: 0   Allergies  Allergen Reactions  . Monistat [Miconazole]       Review of Systems  Constitutional: Positive for fatigue. Negative for fever.  HENT: Positive for congestion.   Respiratory: Negative.   Cardiovascular: Negative.   Gastrointestinal: Negative.   Neurological: Positive for headaches.  Psychiatric/Behavioral: Negative.      Today's Vitals   07/05/20 0913  BP: 114/70  Pulse: 64  Temp: 97.8 F (36.6 C)  TempSrc: Axillary  SpO2: 99%  Height: 5' 2.8" (1.595 m)   Body mass index is 23.71 kg/m.   Objective:  Physical Exam Vitals and nursing note reviewed.  Constitutional:      Appearance: Normal appearance.  HENT:     Head: Normocephalic and atraumatic.     Nose:     Comments: Masked     Mouth/Throat:     Comments: Masked  Eyes:     Extraocular Movements: Extraocular movements intact.  Cardiovascular:     Rate and Rhythm: Normal rate and regular rhythm.     Heart sounds: Normal heart sounds.  Pulmonary:     Effort: Pulmonary effort is normal.     Breath sounds: Normal breath sounds.  Musculoskeletal:     Cervical back: Normal range of motion.  Skin:    General: Skin is warm.  Neurological:     General: No focal deficit present.     Mental Status: She is alert.  Psychiatric:        Mood and Affect: Mood normal.        Behavior: Behavior normal.         Assessment And Plan:     1. COVID-19  Rapid ag test is positive for COVID, neg for flu. She is encouraged to quarantine and isolate herself from other family members. She is encouraged to stay hydrated, take Tylenol for fevers and to rest. I would advise against eating any dairy products at this time. She is advised that she will not return to work until next Tuesday or Wednesday, provided she has not had a fever for 48 hours or more (without meds). She is encouraged to go to ER for evaluation of any worsening of SOB.   - triamcinolone acetonide (KENALOG-40) injection 60 mg - POC COVID-19  2. Sinus congestion Comments: She was given Kenalog-60mg  IM x1. She  will also continue with Norel prn.  - cetirizine-pseudoephedrine (ZYRTEC-D) 5-120 MG tablet; Take 1 tablet by mouth 2 (two) times daily. - triamcinolone acetonide (KENALOG-40) injection 60 mg     Patient was given opportunity to ask questions. Patient verbalized understanding of the plan and was able to repeat key elements of the plan. All questions were answered to their satisfaction.    I, Gwynneth Aliment, MD, have reviewed all documentation for this visit. The documentation on 07/07/20 for the exam, diagnosis, procedures, and orders are all accurate and complete.   IF YOU HAVE BEEN REFERRED TO A SPECIALIST, IT MAY TAKE 1-2 WEEKS TO SCHEDULE/PROCESS THE REFERRAL. IF YOU HAVE NOT HEARD FROM US/SPECIALIST IN TWO WEEKS, PLEASE GIVE [place] A CALL AT [phone].   THE PATIENT IS ENCOURAGED TO PRACTICE SOCIAL DISTANCING DUE TO THE COVID-19 PANDEMIC.

## 2020-07-06 ENCOUNTER — Other Ambulatory Visit: Payer: Self-pay

## 2020-07-06 MED ORDER — NOREL AD 4-10-325 MG PO TABS: ORAL_TABLET | ORAL | 0 refills | Status: DC

## 2020-08-28 ENCOUNTER — Encounter: Payer: Federal, State, Local not specified - PPO | Admitting: Internal Medicine

## 2020-09-03 ENCOUNTER — Other Ambulatory Visit: Payer: Self-pay | Admitting: Internal Medicine

## 2020-09-03 ENCOUNTER — Ambulatory Visit: Payer: Federal, State, Local not specified - PPO | Admitting: Internal Medicine

## 2020-09-03 ENCOUNTER — Other Ambulatory Visit: Payer: Self-pay

## 2020-09-03 ENCOUNTER — Encounter: Payer: Self-pay | Admitting: Internal Medicine

## 2020-09-03 VITALS — BP 108/72 | HR 74 | Temp 98.0°F | Ht 62.8 in | Wt 128.7 lb

## 2020-09-03 DIAGNOSIS — Z1231 Encounter for screening mammogram for malignant neoplasm of breast: Secondary | ICD-10-CM

## 2020-09-03 DIAGNOSIS — M545 Low back pain, unspecified: Secondary | ICD-10-CM | POA: Diagnosis not present

## 2020-09-03 MED ORDER — CYCLOBENZAPRINE HCL 5 MG PO TABS: 5.0000 mg | ORAL_TABLET | Freq: Three times a day (TID) | ORAL | 0 refills | Status: DC | PRN

## 2020-09-03 MED ORDER — KETOROLAC TROMETHAMINE 60 MG/2ML IM SOLN
60.0000 mg | Freq: Once | INTRAMUSCULAR | Status: AC
  Administered 2020-09-03: 60 mg via INTRAMUSCULAR

## 2020-09-03 NOTE — Patient Instructions (Signed)
Acute Back Pain, Adult Acute back pain is sudden and usually short-lived. It is often caused by an injury to the muscles and tissues in the back. The injury may result from: A muscle or ligament getting overstretched or torn (strained). Ligaments are tissues that connect bones to each other. Lifting something improperly can cause a back strain. Wear and tear (degeneration) of the spinal disks. Spinal disks are circular tissue that provide cushioning between the bones of the spine (vertebrae). Twisting motions, such as while playing sports or doing yard work. A hit to the back. Arthritis. You may have a physical exam, lab tests, and imaging tests to find the cause ofyour pain. Acute back pain usually goes away with rest and home care. Follow these instructions at home: Managing pain, stiffness, and swelling Treatment may include medicines for pain and inflammation that are taken by mouth or applied to the skin, prescription pain medicine, or muscle relaxants. Take over-the-counter and prescription medicines only as told by your health care provider. Your health care provider may recommend applying ice during the first 24-48 hours after your pain starts. To do this: Put ice in a plastic bag. Place a towel between your skin and the bag. Leave the ice on for 20 minutes, 2-3 times a day. If directed, apply heat to the affected area as often as told by your health care provider. Use the heat source that your health care provider recommends, such as a moist heat pack or a heating pad. Place a towel between your skin and the heat source. Leave the heat on for 20-30 minutes. Remove the heat if your skin turns bright red. This is especially important if you are unable to feel pain, heat, or cold. You have a greater risk of getting burned. Activity  Do not stay in bed. Staying in bed for more than 1-2 days can delay your recovery. Sit up and stand up straight. Avoid leaning forward when you sit or  hunching over when you stand. If you work at a desk, sit close to it so you do not need to lean over. Keep your chin tucked in. Keep your neck drawn back, and keep your elbows bent at a 90-degree angle (right angle). Sit high and close to the steering wheel when you drive. Add lower back (lumbar) support to your car seat, if needed. Take short walks on even surfaces as soon as you are able. Try to increase the length of time you walk each day. Do not sit, drive, or stand in one place for more than 30 minutes at a time. Sitting or standing for long periods of time can put stress on your back. Do not drive or use heavy machinery while taking prescription pain medicine. Use proper lifting techniques. When you bend and lift, use positions that put less stress on your back: Bend your knees. Keep the load close to your body. Avoid twisting. Exercise regularly as told by your health care provider. Exercising helps your back heal faster and helps prevent back injuries by keeping muscles strong and flexible. Work with a physical therapist to make a safe exercise program, as recommended by your health care provider. Do any exercises as told by your physical therapist.  Lifestyle Maintain a healthy weight. Extra weight puts stress on your back and makes it difficult to have good posture. Avoid activities or situations that make you feel anxious or stressed. Stress and anxiety increase muscle tension and can make back pain worse. Learn ways to manage   anxiety and stress, such as through exercise. General instructions Sleep on a firm mattress in a comfortable position. Try lying on your side with your knees slightly bent. If you lie on your back, put a pillow under your knees. Follow your treatment plan as told by your health care provider. This may include: Cognitive or behavioral therapy. Acupuncture or massage therapy. Meditation or yoga. Contact a health care provider if: You have pain that is not  relieved with rest or medicine. You have increasing pain going down into your legs or buttocks. Your pain does not improve after 2 weeks. You have pain at night. You lose weight without trying. You have a fever or chills. Get help right away if: You develop new bowel or bladder control problems. You have unusual weakness or numbness in your arms or legs. You develop nausea or vomiting. You develop abdominal pain. You feel faint. Summary Acute back pain is sudden and usually short-lived. Use proper lifting techniques. When you bend and lift, use positions that put less stress on your back. Take over-the-counter and prescription medicines and apply heat or ice as directed by your health care provider. This information is not intended to replace advice given to you by your health care provider. Make sure you discuss any questions you have with your healthcare provider. Document Revised: 11/22/2019 Document Reviewed: 11/25/2019 Elsevier Patient Education  2022 Elsevier Inc.  

## 2020-09-03 NOTE — Progress Notes (Signed)
I,Katawbba Wiggins,acting as a Neurosurgeon for Gwynneth Aliment, MD.,have documented all relevant documentation on the behalf of Gwynneth Aliment, MD,as directed by  Gwynneth Aliment, MD while in the presence of Gwynneth Aliment, MD.  This visit occurred during the SARS-CoV-2 public health emergency.  Safety protocols were in place, including screening questions prior to the visit, additional usage of staff PPE, and extensive cleaning of exam room while observing appropriate contact time as indicated for disinfecting solutions.  Subjective:     Patient ID: Anna Powell , female    DOB: 06/27/1970 , 50 y.o.   MRN: [id number]   Chief Complaint  Patient presents with   Back Pain    HPI  The patient is here today for an evaluation of back pain. She reports she did a "heavy leg day" yesterday and feels like she didn't stretch enough. Now with low back pain, radiation into right glute. Denies LE weakness/paresthesias. Denies urinary/fecal incontinence.   Back Pain This is a new problem. The current episode started yesterday. The problem occurs constantly. The pain is present in the lumbar spine. The quality of the pain is described as shooting. The pain is at a severity of 4/10. The pain is moderate. The symptoms are aggravated by sitting. She has tried analgesics for the symptoms. The treatment provided mild relief.    Past Medical History:  Diagnosis Date   Bacterial vaginosis    Breast density 2008   right breast    Constipation    Gestational thrombocytopenia (HCC)    ITP (idiopathic thrombocytopenic purpura)      Family History  Problem Relation Age of Onset   Hypertension Maternal Grandmother    Cancer Paternal Grandfather        lung   Heart disease Other        maternal great-grandmother heart attack    Hypertension Mother    Non-Hodgkin's lymphoma Father      Current Outpatient Medications:    cyclobenzaprine (FLEXERIL) 5 MG tablet, Take 1 tablet (5 mg total) by mouth 3  (three) times daily as needed for muscle spasms., Disp: 30 tablet, Rfl: 0   cetirizine-pseudoephedrine (ZYRTEC-D) 5-120 MG tablet, Take 1 tablet by mouth 2 (two) times daily., Disp: , Rfl:    levocetirizine (XYZAL) 5 MG tablet, levocetirizine 5 mg tablet, Disp: , Rfl:    linaclotide (LINZESS) 145 MCG CAPS capsule, Take 145 mcg by mouth daily before breakfast. As needed, Disp: , Rfl:    NOREL AD 4-10-325 MG TABS, Take 1 tablet by mouth 2 times per day, Disp: 20 tablet, Rfl: 0   Allergies  Allergen Reactions   Monistat [Miconazole]      Review of Systems  Constitutional: Negative.   Respiratory: Negative.    Cardiovascular: Negative.   Gastrointestinal: Negative.   Musculoskeletal:  Positive for back pain.  Psychiatric/Behavioral: Negative.    All other systems reviewed and are negative.   Today's Vitals   09/03/20 1222  BP: 108/72  Pulse: 74  Temp: 98 F (36.7 C)  TempSrc: Oral  Weight: 128 lb 11.2 oz (58.4 kg)  Height: 5' 2.8" (1.595 m)  PainSc: 6   PainLoc: Back   Body mass index is 22.94 kg/m.  Wt Readings from Last 3 Encounters:  09/03/20 128 lb 11.2 oz (58.4 kg)  08/23/19 133 lb (60.3 kg)  05/10/19 134 lb 6.4 oz (61 kg)   BP Readings from Last 3 Encounters:  09/03/20 108/72  07/05/20 114/70  08/23/19 Marland Kitchen)  100/58  . Objective:  Physical Exam Vitals and nursing note reviewed.  Constitutional:      Appearance: Normal appearance.  HENT:     Head: Normocephalic and atraumatic.     Nose:     Comments: Masked     Mouth/Throat:     Comments: Masked  Cardiovascular:     Rate and Rhythm: Normal rate and regular rhythm.     Heart sounds: Normal heart sounds.  Pulmonary:     Effort: Pulmonary effort is normal.     Breath sounds: Normal breath sounds.  Musculoskeletal:     Lumbar back: Spasms and tenderness present. No scoliosis.  Skin:    General: Skin is warm.  Neurological:     General: No focal deficit present.     Mental Status: She is alert.   Psychiatric:        Mood and Affect: Mood normal.        Behavior: Behavior normal.        Assessment And Plan:     1. Acute low back pain without sciatica, unspecified back pain laterality Comments: Encouraged to perform stretching exercise, including Figure 4. I will send rx cyclobenzaprine 5mg  prn. She was also given Toradol, 60mg  IM x 1.  - ketorolac (TORADOL) injection 60 mg   Patient was given opportunity to ask questions. Patient verbalized understanding of the plan and was able to repeat key elements of the plan. All questions were answered to their satisfaction.   I, , MD, have reviewed all documentation for this visit. The documentation on 09/09/20 for the exam, diagnosis, procedures, and orders are all accurate and complete.   IF YOU HAVE BEEN REFERRED TO A SPECIALIST, IT MAY TAKE 1-2 WEEKS TO SCHEDULE/PROCESS THE REFERRAL. IF YOU HAVE NOT HEARD FROM US/SPECIALIST IN TWO WEEKS, PLEASE GIVE Gwynneth Aliment A CALL AT [phone].   THE PATIENT IS ENCOURAGED TO PRACTICE SOCIAL DISTANCING DUE TO THE COVID-19 PANDEMIC.

## 2020-09-05 ENCOUNTER — Ambulatory Visit: Payer: Federal, State, Local not specified - PPO

## 2020-09-12 ENCOUNTER — Encounter: Payer: Self-pay | Admitting: Internal Medicine

## 2020-09-12 ENCOUNTER — Other Ambulatory Visit: Payer: Self-pay

## 2020-09-12 ENCOUNTER — Ambulatory Visit
Admission: RE | Admit: 2020-09-12 | Discharge: 2020-09-12 | Disposition: A | Discharge status: Home / Self Care | Payer: Federal, State, Local not specified - PPO | Source: Ambulatory Visit | Attending: Internal Medicine | Admitting: Internal Medicine

## 2020-09-12 DIAGNOSIS — N951 Menopausal and female climacteric states: Secondary | ICD-10-CM | POA: Diagnosis not present

## 2020-09-12 DIAGNOSIS — Z1231 Encounter for screening mammogram for malignant neoplasm of breast: Secondary | ICD-10-CM

## 2020-09-12 DIAGNOSIS — Z01419 Encounter for gynecological examination (general) (routine) without abnormal findings: Secondary | ICD-10-CM | POA: Diagnosis not present

## 2020-10-09 ENCOUNTER — Ambulatory Visit (INDEPENDENT_AMBULATORY_CARE_PROVIDER_SITE_OTHER): Payer: Federal, State, Local not specified - PPO

## 2020-10-09 ENCOUNTER — Other Ambulatory Visit: Payer: Self-pay

## 2020-10-09 DIAGNOSIS — Z23 Encounter for immunization: Secondary | ICD-10-CM

## 2020-11-01 ENCOUNTER — Telehealth: Payer: Self-pay

## 2020-11-01 NOTE — Telephone Encounter (Signed)
The patient stated that she's had her yearly mammogram.  The information is in the patient's chart.

## 2020-12-11 ENCOUNTER — Other Ambulatory Visit: Payer: Self-pay

## 2020-12-11 ENCOUNTER — Ambulatory Visit (INDEPENDENT_AMBULATORY_CARE_PROVIDER_SITE_OTHER): Payer: Federal, State, Local not specified - PPO | Admitting: Internal Medicine

## 2020-12-11 ENCOUNTER — Encounter: Payer: Self-pay | Admitting: Internal Medicine

## 2020-12-11 VITALS — BP 102/66 | HR 62 | Temp 98.2°F | Ht 62.8 in | Wt 132.0 lb

## 2020-12-11 DIAGNOSIS — H6123 Impacted cerumen, bilateral: Secondary | ICD-10-CM

## 2020-12-11 DIAGNOSIS — Z23 Encounter for immunization: Secondary | ICD-10-CM

## 2020-12-11 DIAGNOSIS — Z0000 Encounter for general adult medical examination without abnormal findings: Secondary | ICD-10-CM | POA: Diagnosis not present

## 2020-12-11 NOTE — Patient Instructions (Signed)
Health Maintenance, Female Adopting a healthy lifestyle and getting preventive care are important in promoting health and wellness. Ask your health care provider about: The right schedule for you to have regular tests and exams. Things you can do on your own to prevent diseases and keep yourself healthy. What should I know about diet, weight, and exercise? Eat a healthy diet  Eat a diet that includes plenty of vegetables, fruits, low-fat dairy products, and lean protein. Do not eat a lot of foods that are high in solid fats, added sugars, or sodium. Maintain a healthy weight Body mass index (BMI) is used to identify weight problems. It estimates body fat based on height and weight. Your health care provider can help determine your BMI and help you achieve or maintain a healthy weight. Get regular exercise Get regular exercise. This is one of the most important things you can do for your health. Most adults should: Exercise for at least 150 minutes each week. The exercise should increase your heart rate and make you sweat (moderate-intensity exercise). Do strengthening exercises at least twice a week. This is in addition to the moderate-intensity exercise. Spend less time sitting. Even light physical activity can be beneficial. Watch cholesterol and blood lipids Have your blood tested for lipids and cholesterol at 52 years of age, then have this test every 5 years. Have your cholesterol levels checked more often if: Your lipid or cholesterol levels are high. You are older than 52 years of age. You are at high risk for heart disease. What should I know about cancer screening? Depending on your health history and family history, you may need to have cancer screening at various ages. This may include screening for: Breast cancer. Cervical cancer. Colorectal cancer. Skin cancer. Lung cancer. What should I know about heart disease, diabetes, and high blood pressure? Blood pressure and heart  disease High blood pressure causes heart disease and increases the risk of stroke. This is more likely to develop in people who have high blood pressure readings, are of African descent, or are overweight. Have your blood pressure checked: Every 3-5 years if you are 18-39 years of age. Every year if you are 40 years old or older. Diabetes Have regular diabetes screenings. This checks your fasting blood sugar level. Have the screening done: Once every three years after age 40 if you are at a normal weight and have a low risk for diabetes. More often and at a younger age if you are overweight or have a high risk for diabetes. What should I know about preventing infection? Hepatitis B If you have a higher risk for hepatitis B, you should be screened for this virus. Talk with your health care provider to find out if you are at risk for hepatitis B infection. Hepatitis C Testing is recommended for: Everyone born from 1945 through 1965. Anyone with known risk factors for hepatitis C. Sexually transmitted infections (STIs) Get screened for STIs, including gonorrhea and chlamydia, if: You are sexually active and are younger than 52 years of age. You are older than 52 years of age and your health care provider tells you that you are at risk for this type of infection. Your sexual activity has changed since you were last screened, and you are at increased risk for chlamydia or gonorrhea. Ask your health care provider if you are at risk. Ask your health care provider about whether you are at high risk for HIV. Your health care provider may recommend a prescription medicine   to help prevent HIV infection. If you choose to take medicine to prevent HIV, you should first get tested for HIV. You should then be tested every 3 months for as long as you are taking the medicine. Pregnancy If you are about to stop having your period (premenopausal) and you may become pregnant, seek counseling before you get  pregnant. Take 400 to 800 micrograms (mcg) of folic acid every day if you become pregnant. Ask for birth control (contraception) if you want to prevent pregnancy. Osteoporosis and menopause Osteoporosis is a disease in which the bones lose minerals and strength with aging. This can result in bone fractures. If you are 65 years old or older, or if you are at risk for osteoporosis and fractures, ask your health care provider if you should: Be screened for bone loss. Take a calcium or vitamin D supplement to lower your risk of fractures. Be given hormone replacement therapy (HRT) to treat symptoms of menopause. Follow these instructions at home: Lifestyle Do not use any products that contain nicotine or tobacco, such as cigarettes, e-cigarettes, and chewing tobacco. If you need help quitting, ask your health care provider. Do not use street drugs. Do not share needles. Ask your health care provider for help if you need support or information about quitting drugs. Alcohol use Do not drink alcohol if: Your health care provider tells you not to drink. You are pregnant, may be pregnant, or are planning to become pregnant. If you drink alcohol: Limit how much you use to 0-1 drink a day. Limit intake if you are breastfeeding. Be aware of how much alcohol is in your drink. In the [place] one drink equals one 12 oz bottle of beer (355 mL), one 5 oz glass of wine (148 mL), or one 1 oz glass of hard liquor (44 mL). General instructions Schedule regular health, dental, and eye exams. Stay current with your vaccines. Tell your health care provider if: You often feel depressed. You have ever been abused or do not feel safe at home. Summary Adopting a healthy lifestyle and getting preventive care are important in promoting health and wellness. Follow your health care provider's instructions about healthy diet, exercising, and getting tested or screened for diseases. Follow your health care provider's  instructions on monitoring your cholesterol and blood pressure. This information is not intended to replace advice given to you by your health care provider. Make sure you discuss any questions you have with your health care provider. Document Revised: 05/11/2020 Document Reviewed: 02/24/2018 Elsevier Patient Education  2022 Elsevier Inc.  

## 2020-12-11 NOTE — Progress Notes (Signed)
[name],acting as a Education administrator for [name], MD.,have documented all relevant documentation on the behalf of [name], MD,as directed by  [name], MD while in the presence of [name], MD.  This visit occurred during the SARS-CoV-2 public health emergency.  Safety protocols were in place, including screening questions prior to the visit, additional usage of staff PPE, and extensive cleaning of exam room while observing appropriate contact time as indicated for disinfecting solutions.  Subjective:     Patient ID: Anna Powell , female    DOB: 03-07-1971 , 50 y.o.   MRN: [id number]   Chief Complaint  Patient presents with   Annual Exam    HPI  Patient here for HM. She is followed by Dr.Dillard for her GYN care. She feels well. She regularly competes in fitness competitions, which help to keep her in shape. She has no specific complaints/concerns at this time.    Past Medical History:  Diagnosis Date   Bacterial vaginosis    Breast density 2008   right breast    Constipation    Gestational thrombocytopenia (HCC)    ITP (idiopathic thrombocytopenic purpura)      Family History  Problem Relation Age of Onset   Hypertension Maternal Grandmother    Cancer Paternal Grandfather        lung   Heart disease Other        maternal great-grandmother heart attack    Hypertension Mother    Non-Hodgkin's lymphoma Father      Current Outpatient Medications:    cetirizine-pseudoephedrine (ZYRTEC-D) 5-120 MG tablet, Take 1 tablet by mouth 2 (two) times daily., Disp: , Rfl:    cyclobenzaprine (FLEXERIL) 5 MG tablet, Take 1 tablet (5 mg total) by mouth 3 (three) times daily as needed for muscle spasms., Disp: 30 tablet, Rfl: 0   levocetirizine (XYZAL) 5 MG tablet, levocetirizine 5 mg tablet, Disp: , Rfl:    linaclotide (LINZESS) 145 MCG CAPS capsule, Take 145 mcg by mouth daily before breakfast. As needed, Disp: , Rfl:    NOREL AD 4-10-325 MG  TABS, Take 1 tablet by mouth 2 times per day, Disp: 20 tablet, Rfl: 0   Allergies  Allergen Reactions   Monistat [Miconazole]       The patient states she uses tubal ligation for birth control. Last LMP was No LMP recorded (lmp unknown).. Negative for Dysmenorrhea. Negative for: breast discharge, breast lump(s), breast pain and breast self exam. Associated symptoms include abnormal vaginal bleeding. Pertinent negatives include abnormal bleeding (hematology), anxiety, decreased libido, depression, difficulty falling sleep, dyspareunia, history of infertility, nocturia, sexual dysfunction, sleep disturbances, urinary incontinence, urinary urgency, vaginal discharge and vaginal itching. Diet regular.The patient states her exercise level is  moderate-strenuous.   . The patient's tobacco use is:  Social History   Tobacco Use  Smoking Status Never  Smokeless Tobacco Never  . She has been exposed to passive smoke. The patient's alcohol use is:  Social History   Substance and Sexual Activity  Alcohol Use No    Review of Systems  Constitutional: Negative.   HENT: Negative.    Eyes: Negative.   Respiratory: Negative.    Cardiovascular: Negative.   Gastrointestinal: Negative.   Endocrine: Negative.   Genitourinary: Negative.   Musculoskeletal: Negative.   Skin: Negative.   Allergic/Immunologic: Negative.   Neurological: Negative.   Hematological: Negative.   Psychiatric/Behavioral: Negative.      Today's Vitals   12/11/20 1431  BP:  102/66  Pulse: 62  Temp: 98.2 F (36.8 C)  TempSrc: Oral  Weight: 132 lb (59.9 kg)  Height: 5' 2.8" (1.595 m)   Body mass index is 23.53 kg/m.  Wt Readings from Last 3 Encounters:  12/11/20 132 lb (59.9 kg)  09/03/20 128 lb 11.2 oz (58.4 kg)  08/23/19 133 lb (60.3 kg)    BP Readings from Last 3 Encounters:  12/11/20 102/66  09/03/20 108/72  07/05/20 114/70    Objective:  Physical Exam Vitals and nursing note reviewed.  Constitutional:       Appearance: Normal appearance.  HENT:     Head: Normocephalic and atraumatic.     Right Ear: Ear canal and external ear normal. There is impacted cerumen.     Left Ear: Ear canal and external ear normal. There is impacted cerumen.     Nose:     Comments: Masked     Mouth/Throat:     Mouth: Mucous membranes are moist.     Pharynx: Oropharynx is clear.     Comments: Masked  Eyes:     Extraocular Movements: Extraocular movements intact.     Conjunctiva/sclera: Conjunctivae normal.     Pupils: Pupils are equal, round, and reactive to light.  Cardiovascular:     Rate and Rhythm: Normal rate and regular rhythm.     Pulses: Normal pulses.     Heart sounds: Normal heart sounds.  Pulmonary:     Effort: Pulmonary effort is normal.     Breath sounds: Normal breath sounds.  Chest:  Breasts:    Tanner Score is 4.     Right: Normal.     Left: Normal.  Abdominal:     General: Abdomen is flat. Bowel sounds are normal.     Palpations: Abdomen is soft.  Genitourinary:    Comments: deferred Musculoskeletal:        General: Normal range of motion.     Cervical back: Normal range of motion and neck supple.  Skin:    General: Skin is warm and dry.  Neurological:     General: No focal deficit present.     Mental Status: She is alert and oriented to person, place, and time.  Psychiatric:        Mood and Affect: Mood normal.        Behavior: Behavior normal.        Assessment And Plan:     1. Encounter for general adult medical examination w/o abnormal findings Comments: A full exam was performed. Importance of monthly self breast exams was discussed with the patient. PATIENT IS ADVISED TO GET 30-45 MINUTES REGULAR EXERCISE NO LESS THAN FOUR TO FIVE DAYS PER WEEK - BOTH WEIGHTBEARING EXERCISES AND AEROBIC ARE RECOMMENDED.  PATIENT IS ADVISED TO FOLLOW A HEALTHY DIET WITH AT LEAST SIX FRUITS/VEGGIES PER DAY, DECREASE INTAKE OF RED MEAT, AND TO INCREASE FISH INTAKE TO TWO DAYS PER WEEK.   MEATS/FISH SHOULD NOT BE FRIED, BAKED OR BROILED IS PREFERABLE.  IT IS ALSO IMPORTANT TO CUT BACK ON YOUR SUGAR INTAKE. PLEASE AVOID ANYTHING WITH ADDED SUGAR, CORN SYRUP OR OTHER SWEETENERS. IF YOU MUST USE A SWEETENER, YOU CAN TRY STEVIA. IT IS ALSO IMPORTANT TO AVOID ARTIFICIALLY SWEETENERS AND DIET BEVERAGES. LASTLY, I SUGGEST WEARING SPF 50 SUNSCREEN ON EXPOSED PARTS AND ESPECIALLY WHEN IN THE DIRECT SUNLIGHT FOR AN EXTENDED PERIOD OF TIME.  PLEASE AVOID FAST FOOD RESTAURANTS AND INCREASE YOUR WATER INTAKE.  - CMP14+EGFR; Future - CBC; Future - Lipid  panel; Future - Insulin, random(561); Future  2. Bilateral impacted cerumen Comments: AFTER OBTAINING VERBAL CONSENT, BOTH EARS WERE FLUSHED BY IRRIGATION. SHE TOLERATED PROCEDURE WELL WITHOUT ANY COMPLICATIONS. NO TM ABNORMALITIES WERE NOTED.  - Ear Lavage  3. Immunization due Comments: I will send rx Shingrix to her local pharmacy. She plans to get her flu vaccine by the end of October.    Patient was given opportunity to ask questions. Patient verbalized understanding of the plan and was able to repeat key elements of the plan. All questions were answered to their satisfaction.   I, [name], MD, have reviewed all documentation for this visit. The documentation on 12/11/20 for the exam, diagnosis, procedures, and orders are all accurate and complete.   THE PATIENT IS ENCOURAGED TO PRACTICE SOCIAL DISTANCING DUE TO THE COVID-19 PANDEMIC.

## 2020-12-12 ENCOUNTER — Other Ambulatory Visit: Payer: Federal, State, Local not specified - PPO

## 2020-12-12 DIAGNOSIS — Z0000 Encounter for general adult medical examination without abnormal findings: Secondary | ICD-10-CM

## 2020-12-12 DIAGNOSIS — Z823 Family history of stroke: Secondary | ICD-10-CM | POA: Diagnosis not present

## 2020-12-13 LAB — INSULIN, RANDOM: INSULIN: 5.8 u[IU]/mL (ref 2.6–24.9)

## 2020-12-13 LAB — CMP14+EGFR
ALT: 24 IU/L (ref 0–32)
AST: 24 IU/L (ref 0–40)
Albumin/Globulin Ratio: 1.5 (ref 1.2–2.2)
Albumin: 4.4 g/dL (ref 3.8–4.9)
Alkaline Phosphatase: 62 IU/L (ref 44–121)
BUN/Creatinine Ratio: 15 (ref 9–23)
BUN: 17 mg/dL (ref 6–24)
Bilirubin Total: 0.5 mg/dL (ref 0.0–1.2)
CO2: 25 mmol/L (ref 20–29)
Calcium: 9.7 mg/dL (ref 8.7–10.2)
Chloride: 102 mmol/L (ref 96–106)
Creatinine, Ser: 1.11 mg/dL — ABNORMAL HIGH (ref 0.57–1.00)
Globulin, Total: 3 g/dL (ref 1.5–4.5)
Glucose: 86 mg/dL (ref 70–99)
Potassium: 4.3 mmol/L (ref 3.5–5.2)
Sodium: 140 mmol/L (ref 134–144)
Total Protein: 7.4 g/dL (ref 6.0–8.5)
eGFR: 60 mL/min/{1.73_m2} (ref >59)

## 2020-12-13 LAB — LIPID PANEL
Chol/HDL Ratio: 2 ratio (ref 0.0–4.4)
Cholesterol, Total: 151 mg/dL (ref 100–199)
HDL: 75 mg/dL (ref >39)
LDL Chol Calc (NIH): 66 mg/dL (ref 0–99)
Triglycerides: 42 mg/dL (ref 0–149)
VLDL Cholesterol Cal: 10 mg/dL (ref 5–40)

## 2020-12-13 LAB — CBC
Hematocrit: 39.5 % (ref 34.0–46.6)
Hemoglobin: 12.4 g/dL (ref 11.1–15.9)
MCH: 27.6 pg (ref 26.6–33.0)
MCHC: 31.4 g/dL — ABNORMAL LOW (ref 31.5–35.7)
MCV: 88 fL (ref 79–97)
Platelets: 61 10*3/uL — CL (ref 150–450)
RBC: 4.49 x10E6/uL (ref 3.77–5.28)
RDW: 12.3 % (ref 11.7–15.4)
WBC: 5.6 10*3/uL (ref 3.4–10.8)

## 2020-12-19 ENCOUNTER — Other Ambulatory Visit: Payer: Federal, State, Local not specified - PPO

## 2020-12-19 ENCOUNTER — Other Ambulatory Visit: Payer: Self-pay

## 2020-12-19 DIAGNOSIS — D696 Thrombocytopenia, unspecified: Secondary | ICD-10-CM | POA: Diagnosis not present

## 2020-12-20 LAB — CBC WITH DIFFERENTIAL/PLATELET
Basophils Absolute: 0 10*3/uL (ref 0.0–0.2)
Basos: 0 %
EOS (ABSOLUTE): 0.1 10*3/uL (ref 0.0–0.4)
Eos: 1 %
Hematocrit: 38.6 % (ref 34.0–46.6)
Hemoglobin: 11.8 g/dL (ref 11.1–15.9)
Immature Grans (Abs): 0 10*3/uL (ref 0.0–0.1)
Immature Granulocytes: 0 %
Lymphocytes Absolute: 2 10*3/uL (ref 0.7–3.1)
Lymphs: 34 %
MCH: 26.7 pg (ref 26.6–33.0)
MCHC: 30.6 g/dL — ABNORMAL LOW (ref 31.5–35.7)
MCV: 87 fL (ref 79–97)
Monocytes Absolute: 0.5 10*3/uL (ref 0.1–0.9)
Monocytes: 8 %
Neutrophils Absolute: 3.3 10*3/uL (ref 1.4–7.0)
Neutrophils: 57 %
Platelets: 86 10*3/uL — CL (ref 150–450)
RBC: 4.42 x10E6/uL (ref 3.77–5.28)
RDW: 12.3 % (ref 11.7–15.4)
WBC: 5.8 10*3/uL (ref 3.4–10.8)

## 2020-12-20 NOTE — Progress Notes (Signed)
I think you had seen her.

## 2020-12-24 ENCOUNTER — Other Ambulatory Visit: Payer: Self-pay | Admitting: Internal Medicine

## 2020-12-24 DIAGNOSIS — D696 Thrombocytopenia, unspecified: Secondary | ICD-10-CM

## 2020-12-25 ENCOUNTER — Telehealth: Payer: Self-pay | Admitting: Hematology and Oncology

## 2020-12-25 NOTE — Telephone Encounter (Signed)
Scheduled appt per 10/10 referral. Pt is aware of appt date and time.  

## 2020-12-26 ENCOUNTER — Inpatient Hospital Stay
Payer: Federal, State, Local not specified - PPO | Attending: Hematology and Oncology | Admitting: Hematology and Oncology

## 2020-12-26 ENCOUNTER — Other Ambulatory Visit: Payer: Self-pay

## 2020-12-26 ENCOUNTER — Inpatient Hospital Stay: Payer: Federal, State, Local not specified - PPO

## 2020-12-26 VITALS — BP 117/72 | HR 70 | Temp 98.4°F | Resp 17 | Wt 133.0 lb

## 2020-12-26 DIAGNOSIS — D509 Iron deficiency anemia, unspecified: Secondary | ICD-10-CM | POA: Insufficient documentation

## 2020-12-26 DIAGNOSIS — Z807 Family history of other malignant neoplasms of lymphoid, hematopoietic and related tissues: Secondary | ICD-10-CM | POA: Diagnosis not present

## 2020-12-26 DIAGNOSIS — Z801 Family history of malignant neoplasm of trachea, bronchus and lung: Secondary | ICD-10-CM | POA: Insufficient documentation

## 2020-12-26 DIAGNOSIS — D696 Thrombocytopenia, unspecified: Secondary | ICD-10-CM | POA: Insufficient documentation

## 2020-12-26 LAB — CBC WITH DIFFERENTIAL/PLATELET
Abs Immature Granulocytes: 0 10*3/uL (ref 0.00–0.07)
Basophils Absolute: 0 10*3/uL (ref 0.0–0.1)
Basophils Relative: 0 %
Eosinophils Absolute: 0.1 10*3/uL (ref 0.0–0.5)
Eosinophils Relative: 1 %
HCT: 37.1 % (ref 36.0–46.0)
Hemoglobin: 11.5 g/dL — ABNORMAL LOW (ref 12.0–15.0)
Immature Granulocytes: 0 %
Lymphocytes Relative: 34 %
Lymphs Abs: 1.8 10*3/uL (ref 0.7–4.0)
MCH: 27.6 pg (ref 26.0–34.0)
MCHC: 31 g/dL (ref 30.0–36.0)
MCV: 89.2 fL (ref 80.0–100.0)
Monocytes Absolute: 0.4 10*3/uL (ref 0.1–1.0)
Monocytes Relative: 8 %
Neutro Abs: 3 10*3/uL (ref 1.7–7.7)
Neutrophils Relative %: 57 %
Platelets: 148 10*3/uL — ABNORMAL LOW (ref 150–400)
RBC: 4.16 MIL/uL (ref 3.87–5.11)
RDW: 13.6 % (ref 11.5–15.5)
WBC: 5.3 10*3/uL (ref 4.0–10.5)
nRBC: 0 % (ref 0.0–0.2)

## 2020-12-26 LAB — IRON AND TIBC
Iron: 78 ug/dL (ref 28–170)
Saturation Ratios: 23 % (ref 10.4–31.8)
TIBC: 338 ug/dL (ref 250–450)
UIBC: 260 ug/dL

## 2020-12-26 LAB — FERRITIN: Ferritin: 26 ng/mL (ref 11–307)

## 2020-12-26 NOTE — Progress Notes (Signed)
[facility] CONSULT NOTE  Patient Care Team: Dorothyann Peng, MD as PCP - General (Internal Medicine)  CHIEF COMPLAINTS/PURPOSE OF CONSULTATION:  Thrombocytopenia  ASSESSMENT & PLAN:  This is a very pleasant 50 year old female patient with no significant past medical history except gestational thrombocytopenia and history of ITP referred to hematology for the same.  She denies any health complaints.  No known autoimmune diseases, alcohol use or chronic liver disease.  No history of hepatitis.  She feels quite well.  Physical examination unremarkable today.  We have discussed the following details about thrombocytopenia.  Thrombocytopenia is defined as a platelet count below the lower limit of normal (ie, <150,000/microL [150 x 109/L] for adults).  Degrees of thrombocytopenia can be further subdivided into mild (platelet count 100,000 to 150,000/microL), moderate (50,000 to 99,000/microL), and severe (<50,000/microL) Most common causes of thrombocytopenia include but not limited to chronic liver disease or hypersplenism, immune thrombocytopenia, viral infections such as Hepatitis, HIV, active bacterial infections, autoimmune diseases, alcohol, nutritional deficiencies and medications vs Pseudothrombocytopenia. 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  Labs from our clinic today showed a platelet count of 148,000 which is near normal hence I do not recommend any further investigation except for follow-up.  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 She does have some evidence of mild iron deficiency as well as mild anemia.  She complained of some fatigue.  Will recommend oral iron supplementation once a day for about 3 months and follow-up.  Thank you for consulting [place] the care of this patient.  Please do not hesitate to contact us with any additional  questions or concerns.   HISTORY OF PRESENTING ILLNESS:  Anna Powell 50 y.o. female is here because of thrombocytopenia.  This is a very pleasant 50 year old female patient with past medical history significant for ITP and gestational thrombocytopenia referred to hematology for evaluation of thrombocytopenia.  Patient has had gestational thrombocytopenia in the past but does not recollect having normal platelet count recently.  She denies any complaint such as bleeding or easy bruising.  There is no change in bowel habits.  No change in urinary habits.  She feels well overall.  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  She denies any known autoimmune diseases, alcohol, liver diseases.  No recent medications, hospitalizations.   Rest of the pertinent 10 point ROS reviewed and negative.  REVIEW OF SYSTEMS:   Constitutional: Denies fevers, chills or abnormal night sweats Eyes: Denies blurriness of vision, double vision or watery eyes Ears, nose, mouth, throat, and face: Denies mucositis or sore throat Respiratory: Denies cough, dyspnea or wheezes Cardiovascular: Denies palpitation, chest discomfort or lower extremity swelling Gastrointestinal:  Denies nausea, heartburn or change in bowel habits Skin: Denies abnormal skin rashes Lymphatics: Denies new lymphadenopathy or easy bruising Neurological:Denies numbness, tingling or new weaknesses Behavioral/Psych: Mood is stable, no new changes  All other systems were reviewed with the patient and are negative.  MEDICAL HISTORY:  Past Medical History:  Diagnosis Date   Bacterial vaginosis    Breast density 2008   right breast    Constipation    Gestational thrombocytopenia (HCC)    ITP (idiopathic thrombocytopenic purpura)     SURGICAL HISTORY: Past Surgical History:  Procedure Laterality Date   CESAREAN SECTION  02/18/2001   Dr. Normand Sloop   ENDOMETRIAL BIOPSY  11/01/2009   TUBAL LIGATION  02/18/2001   Dillard  SOCIAL  HISTORY: Social History   Socioeconomic History   Marital status: Married    Spouse name: Not on file   Number of children: Not on file   Years of education: Not on file   Highest education level: Not on file  Occupational History   Not on file  Tobacco Use   Smoking status: Never   Smokeless tobacco: Never  Vaping Use   Vaping Use: Never used  Substance and Sexual Activity   Alcohol use: No   Drug use: No   Sexual activity: Yes    Birth control/protection: Surgical    Comment: BTL  Other Topics Concern   Not on file  Social History Narrative   Not on file   Social Determinants of Health   Financial Resource Strain: Not on file  Food Insecurity: Not on file  Transportation Needs: Not on file  Physical Activity: Not on file  Stress: Not on file  Social Connections: Not on file  Intimate Partner Violence: Not on file    FAMILY HISTORY: Family History  Problem Relation Age of Onset   Hypertension Maternal Grandmother    Cancer Paternal Grandfather        lung   Heart disease Other        maternal great-grandmother heart attack    Hypertension Mother    Non-Hodgkin's lymphoma Father     ALLERGIES:  is allergic to monistat [miconazole].  MEDICATIONS:  Current Outpatient Medications  Medication Sig Dispense Refill   cetirizine-pseudoephedrine (ZYRTEC-D) 5-120 MG tablet Take 1 tablet by mouth 2 (two) times daily.     cyclobenzaprine (FLEXERIL) 5 MG tablet Take 1 tablet (5 mg total) by mouth 3 (three) times daily as needed for muscle spasms. 30 tablet 0   levocetirizine (XYZAL) 5 MG tablet levocetirizine 5 mg tablet     linaclotide (LINZESS) 145 MCG CAPS capsule Take 145 mcg by mouth daily before breakfast. As needed     NOREL AD 4-10-325 MG TABS Take 1 tablet by mouth 2 times per day 20 tablet 0   No current facility-administered medications for this visit.    PHYSICAL EXAMINATION: ECOG PERFORMANCE STATUS: 0 - Asymptomatic  Vitals:   12/26/20 1332  BP:  117/72  Pulse: 70  Resp: 17  Temp: 98.4 F (36.9 C)  SpO2: 100%   Filed Weights   12/26/20 1332  Weight: 133 lb (60.3 kg)    GENERAL:alert, no distress and comfortable SKIN: skin color, texture, turgor are normal, no rashes or significant lesions EYES: normal, conjunctiva are pink and non-injected, sclera clear OROPHARYNX:no exudate, no erythema and lips, buccal mucosa, and tongue normal  NECK: supple, thyroid normal size, non-tender, without nodularity LYMPH:  no palpable lymphadenopathy in the cervical, axillary or inguinal LUNGS: clear to auscultation and percussion with normal breathing effort HEART: regular rate & rhythm and no murmurs and no lower extremity edema ABDOMEN:abdomen soft, non-tender and normal bowel sounds Musculoskeletal:no cyanosis of digits and no clubbing  PSYCH: alert & oriented x 3 with fluent speech NEURO: no focal motor/sensory deficits  LABORATORY DATA:  I have reviewed the data as listed Lab Results  Component Value Date   WBC 5.8 12/19/2020   HGB 11.8 12/19/2020   HCT 38.6 12/19/2020   MCV 87 12/19/2020   PLT 86 (LL) 12/19/2020     Chemistry      Component Value Date/Time   NA 140 12/12/2020 1008   K 4.3 12/12/2020 1008   CL 102 12/12/2020 1008  CO2 25 12/12/2020 1008   BUN 17 12/12/2020 1008   CREATININE 1.11 (H) 12/12/2020 1008      Component Value Date/Time   CALCIUM 9.7 12/12/2020 1008   ALKPHOS 62 12/12/2020 1008   AST 24 12/12/2020 1008   ALT 24 12/12/2020 1008   BILITOT 0.5 12/12/2020 1008       RADIOGRAPHIC STUDIES: I have personally reviewed the radiological images as listed and agreed with the findings in the report. No results found.  All questions were answered. The patient knows to call the clinic with any problems, questions or concerns. I spent 45 minutes in the care of this patient including H and P, review of records, counseling and coordination of care.     Rachel Moulds, MD 12/26/2020 1:52 PM

## 2020-12-27 ENCOUNTER — Encounter: Payer: Self-pay | Admitting: Hematology and Oncology

## 2020-12-27 LAB — COMPREHENSIVE METABOLIC PANEL
ALT: 24 U/L (ref 0–44)
AST: 27 U/L (ref 15–41)
Albumin: 4 g/dL (ref 3.5–5.0)
Alkaline Phosphatase: 55 U/L (ref 38–126)
Anion gap: 6 (ref 5–15)
BUN: 16 mg/dL (ref 6–20)
CO2: 29 mmol/L (ref 22–32)
Calcium: 9.9 mg/dL (ref 8.9–10.3)
Chloride: 106 mmol/L (ref 98–111)
Creatinine, Ser: 1.01 mg/dL — ABNORMAL HIGH (ref 0.44–1.00)
GFR, Estimated: >60 mL/min (ref >60)
Glucose, Bld: 91 mg/dL (ref 70–99)
Potassium: 4.1 mmol/L (ref 3.5–5.1)
Sodium: 141 mmol/L (ref 135–145)
Total Bilirubin: 0.3 mg/dL (ref 0.3–1.2)
Total Protein: 7.2 g/dL (ref 6.5–8.1)

## 2020-12-27 LAB — PATHOLOGIST SMEAR REVIEW

## 2021-01-09 ENCOUNTER — Other Ambulatory Visit: Payer: Self-pay

## 2021-01-09 ENCOUNTER — Ambulatory Visit (INDEPENDENT_AMBULATORY_CARE_PROVIDER_SITE_OTHER): Payer: Federal, State, Local not specified - PPO

## 2021-01-09 VITALS — BP 120/60 | HR 60 | Temp 98.6°F | Ht 62.8 in | Wt 130.0 lb

## 2021-01-09 DIAGNOSIS — Z23 Encounter for immunization: Secondary | ICD-10-CM | POA: Diagnosis not present

## 2021-01-09 NOTE — Patient Instructions (Signed)
Influenza Virus Vaccine injection What is this medication? INFLUENZA VIRUS VACCINE (in floo EN zuh VAHY ruhs vak SEEN) helps to reduce the risk of getting influenza also known as the flu. The vaccine only helps protect you against some strains of the flu. This medicine may be used for other purposes; ask your health care provider or pharmacist if you have questions. COMMON BRAND NAME(S): Afluria, Afluria Quadrivalent, Agriflu, Alfuria, FLUAD, FLUAD Quadrivalent, Fluarix, Fluarix Quadrivalent, Flublok, Flublok Quadrivalent, FLUCELVAX, FLUCELVAX Quadrivalent, Flulaval, Flulaval Quadrivalent, Fluvirin, Fluzone, Fluzone High-Dose, Fluzone Intradermal, Fluzone Quadrivalent What should I tell my care team before I take this medication? They need to know if you have any of these conditions: bleeding disorder like hemophilia fever or infection Guillain-Barre syndrome or other neurological problems immune system problems infection with the human immunodeficiency virus (HIV) or AIDS low blood platelet counts multiple sclerosis an unusual or allergic reaction to influenza virus vaccine, latex, other medicines, foods, dyes, or preservatives. Different brands of vaccines contain different allergens. Some may contain latex or eggs. Talk to your doctor about your allergies to make sure that you get the right vaccine. pregnant or trying to get pregnant breast-feeding How should I use this medication? This vaccine is for injection into a muscle or under the skin. It is given by a health care professional. A copy of Vaccine Information Statements will be given before each vaccination. Read this sheet carefully each time. The sheet may change frequently. Talk to your healthcare provider to see which vaccines are right for you. Some vaccines should not be used in all age groups. Overdosage: If you think you have taken too much of this medicine contact a poison control center or emergency room at once. NOTE: This  medicine is only for you. Do not share this medicine with others. What if I miss a dose? This does not apply. What may interact with this medication? chemotherapy or radiation therapy medicines that lower your immune system like etanercept, anakinra, infliximab, and adalimumab medicines that treat or prevent blood clots like warfarin phenytoin steroid medicines like prednisone or cortisone theophylline vaccines This list may not describe all possible interactions. Give your health care provider a list of all the medicines, herbs, non-prescription drugs, or dietary supplements you use. Also tell them if you smoke, drink alcohol, or use illegal drugs. Some items may interact with your medicine. What should I watch for while using this medication? Report any side effects that do not go away within 3 days to your doctor or health care professional. Call your health care provider if any unusual symptoms occur within 6 weeks of receiving this vaccine. You may still catch the flu, but the illness is not usually as bad. You cannot get the flu from the vaccine. The vaccine will not protect against colds or other illnesses that may cause fever. The vaccine is needed every year. What side effects may I notice from receiving this medication? Side effects that you should report to your doctor or health care professional as soon as possible: allergic reactions like skin rash, itching or hives, swelling of the face, lips, or tongue Side effects that usually do not require medical attention (report to your doctor or health care professional if they continue or are bothersome): fever headache muscle aches and pains pain, tenderness, redness, or swelling at the injection site tiredness Side effects that you should report to your doctor or health care professional as soon as possible: allergic reactions like skin rash, itching or hives, swelling of   the face, lips, or tongue Side effects that usually do not  require medical attention (report to your doctor or health care professional if they continue or are bothersome): fever headache muscle aches and pains pain, tenderness, redness, or swelling at the injection site tiredness This list may not describe all possible side effects. Call your doctor for medical advice about side effects. You may report side effects to FDA at 1-800-FDA-1088. Where should I keep my medication? The vaccine will be given by a health care professional in a clinic, pharmacy, doctor's office, or other health care setting. You will not be given vaccine doses to store at home. NOTE: This sheet is a summary. It may not cover all possible information. If you have questions about this medicine, talk to your doctor, pharmacist, or health care provider.  2022 Elsevier/Gold Standard (2019-11-08 19:49:22)  

## 2021-01-09 NOTE — Progress Notes (Signed)
Patient is here for flu vaccine

## 2021-01-10 ENCOUNTER — Other Ambulatory Visit: Payer: Self-pay | Admitting: Physician Assistant

## 2021-01-10 DIAGNOSIS — D696 Thrombocytopenia, unspecified: Secondary | ICD-10-CM

## 2021-01-10 DIAGNOSIS — D508 Other iron deficiency anemias: Secondary | ICD-10-CM

## 2021-01-11 ENCOUNTER — Inpatient Hospital Stay: Payer: Federal, State, Local not specified - PPO | Admitting: Physician Assistant

## 2021-01-11 ENCOUNTER — Other Ambulatory Visit: Payer: Self-pay

## 2021-01-11 DIAGNOSIS — D508 Other iron deficiency anemias: Secondary | ICD-10-CM | POA: Diagnosis not present

## 2021-01-11 DIAGNOSIS — D696 Thrombocytopenia, unspecified: Secondary | ICD-10-CM

## 2021-01-11 DIAGNOSIS — Z807 Family history of other malignant neoplasms of lymphoid, hematopoietic and related tissues: Secondary | ICD-10-CM | POA: Diagnosis not present

## 2021-01-11 DIAGNOSIS — D509 Iron deficiency anemia, unspecified: Secondary | ICD-10-CM | POA: Insufficient documentation

## 2021-01-11 DIAGNOSIS — Z801 Family history of malignant neoplasm of trachea, bronchus and lung: Secondary | ICD-10-CM | POA: Diagnosis not present

## 2021-01-11 MED ORDER — FERROUS SULFATE 325 (65 FE) MG PO TBEC: 325.0000 mg | DELAYED_RELEASE_TABLET | Freq: Every day | ORAL | 3 refills | Status: DC

## 2021-01-11 NOTE — Progress Notes (Signed)
[facility]  PROGRESS NOTE  Patient Care Team: Dorothyann Peng, MD as PCP - General (Internal Medicine)  CHIEF COMPLAINTS/PURPOSE OF CONSULTATION:  Thrombocytopenia  HISTORY OF PRESENTING ILLNESS:  Anna Powell 50 y.o. female returns for a follow up for thrombocytopenia. She is unaccompanied for this visit. She reports some fatigue but is able to complete her ADLs on her own. She adds due to participation in fitness competitions, she has a regimented meal plan. She does not eat red meat and only eats fish and chicken. She denies any nausea, vomiting or abdominal pain. She has intermittent episodes of constipation secondary to IBS. She takes Linzess 2-3 times per week to regular her bowel habits. She denies easy bruising or signs of bleeding. She adds that her menstrual cycles are very light. She has noticed some hair thinning. She denies any fevers, chills, night sweats, shortness of breath, chest pain or cough. She has no other complaints.  Rest of the pertinent 10 point ROS reviewed and negative.  REVIEW OF SYSTEMS:   Constitutional: Denies fevers, chills or abnormal night sweats Eyes: Denies blurriness of vision, double vision or watery eyes Ears, nose, mouth, throat, and face: Denies mucositis or sore throat Respiratory: Denies cough, dyspnea or wheezes Cardiovascular: Denies palpitation, chest discomfort or lower extremity swelling Gastrointestinal:  Denies nausea, heartburn or change in bowel habits Skin: Denies abnormal skin rashes Lymphatics: Denies new lymphadenopathy or easy bruising Neurological:Denies numbness, tingling or new weaknesses Behavioral/Psych: Mood is stable, no new changes    MEDICAL HISTORY:  Past Medical History:  Diagnosis Date   Bacterial vaginosis    Breast density 2008   right breast    Constipation    Gestational thrombocytopenia (HCC)    ITP (idiopathic thrombocytopenic purpura)     SURGICAL HISTORY: Past Surgical History:   Procedure Laterality Date   CESAREAN SECTION  02/18/2001   Dr. Normand Sloop   ENDOMETRIAL BIOPSY  11/01/2009   TUBAL LIGATION  02/18/2001   [facility]    SOCIAL HISTORY: Social History   Socioeconomic History   Marital status: Married    Spouse name: Not on file   Number of children: Not on file   Years of education: Not on file   Highest education level: Not on file  Occupational History   Not on file  Tobacco Use   Smoking status: Never   Smokeless tobacco: Never  Vaping Use   Vaping Use: Never used  Substance and Sexual Activity   Alcohol use: No   Drug use: No   Sexual activity: Yes    Birth control/protection: Surgical    Comment: BTL  Other Topics Concern   Not on file  Social History Narrative   Not on file   Social Determinants of Health   Financial Resource Strain: Not on file  Food Insecurity: Not on file  Transportation Needs: Not on file  Physical Activity: Not on file  Stress: Not on file  Social Connections: Not on file  Intimate Partner Violence: Not on file    FAMILY HISTORY: Family History  Problem Relation Age of Onset   Hypertension Maternal Grandmother    Cancer Paternal Grandfather        lung   Heart disease Other        maternal great-grandmother heart attack    Hypertension Mother    Non-Hodgkin's lymphoma Father     ALLERGIES:  is allergic to monistat [miconazole].  MEDICATIONS:  Current Outpatient Medications  Medication Sig Dispense Refill  Celecoxib (CELEBREX PO) As needed     cetirizine-pseudoephedrine (ZYRTEC-D) 5-120 MG tablet Take 1 tablet by mouth 2 (two) times daily.     cyclobenzaprine (FLEXERIL) 5 MG tablet Take 1 tablet (5 mg total) by mouth 3 (three) times daily as needed for muscle spasms. 30 tablet 0   ferrous sulfate 325 (65 FE) MG EC tablet Take 1 tablet (325 mg total) by mouth daily with breakfast. 30 tablet 3   fluticasone (FLONASE) 50 MCG/ACT nasal spray fluticasone propionate 50 mcg/actuation nasal  spray,suspension  USE 2 SPRAYS IN EACH NOSTRIL EVERY DAY AT NIGHT     levocetirizine (XYZAL) 5 MG tablet levocetirizine 5 mg tablet     linaclotide (LINZESS) 145 MCG CAPS capsule Take 145 mcg by mouth daily before breakfast. As needed     NOREL AD 4-10-325 MG TABS Take 1 tablet by mouth 2 times per day 20 tablet 0   No current facility-administered medications for this visit.    PHYSICAL EXAMINATION: ECOG PERFORMANCE STATUS: 0 - Asymptomatic  Vitals:   01/11/21 0854  BP: (!) 114/59  Pulse: 70  Resp: 16  Temp: (!) 97.2 F (36.2 C)  SpO2: 100%   Filed Weights   01/11/21 0854  Weight: 134 lb 1 oz (60.8 kg)    GENERAL:alert, no distress and comfortable SKIN: skin color, texture, turgor are normal, no rashes or significant lesions EYES: normal, conjunctiva are pink and non-injected, sclera clear OROPHARYNX:no exudate, no erythema and lips, buccal mucosa, and tongue normal  LYMPH:  no palpable lymphadenopathy in the cervical or supraclavicular region.  LUNGS: clear to auscultation and percussion with normal breathing effort HEART: regular rate & rhythm and no murmurs and no lower extremity edema ABDOMEN:abdomen soft, non-tender and normal bowel sounds Musculoskeletal:no cyanosis of digits and no clubbing  PSYCH: alert & oriented x 3 with fluent speech NEURO: no focal motor/sensory deficits  LABORATORY DATA:  I have reviewed the data as listed Lab Results  Component Value Date   WBC 5.3 12/26/2020   HGB 11.5 (L) 12/26/2020   HCT 37.1 12/26/2020   MCV 89.2 12/26/2020   PLT 148 (L) 12/26/2020     Chemistry      Component Value Date/Time   NA 141 12/26/2020 1435   NA 140 12/12/2020 1008   K 4.1 12/26/2020 1435   CL 106 12/26/2020 1435   CO2 29 12/26/2020 1435   BUN 16 12/26/2020 1435   BUN 17 12/12/2020 1008   CREATININE 1.01 (H) 12/26/2020 1435      Component Value Date/Time   CALCIUM 9.9 12/26/2020 1435   ALKPHOS 55 12/26/2020 1435   AST 27 12/26/2020 1435    ALT 24 12/26/2020 1435   BILITOT 0.3 12/26/2020 1435   BILITOT 0.5 12/12/2020 1008      RADIOGRAPHIC STUDIES: I have personally reviewed the radiological images as listed and agreed with the findings in the report. No results found.  ASSESSMENT & PLAN:  Anna Powell is a 50 y.o. female who returns for follow-up.  I reviewed patient's lab results from 12/26/2020, that showed platelet count of 148K, which is near normal.  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  Additional diagnostic testing is not required at this time.  Externally, there is evidence of mild iron deficiency anemia.  With patient's symptoms of fatigue and hair thinning, recommendation is to initiate oral iron supplementation with ferrous fate 325 mg once daily.  I advised patient to take oral  iron with a source of vitamin C and avoid taking concomitantly with antacids/calcium.  Likely cause of iron deficiency is due to patient's dietary restrictions.  I provided a list of iron rich foods for patient to incorporate into her diet.  Patient will return to the clinic in 3 months with repeat labs.  All questions were answered. The patient knows to call the clinic with any problems, questions or concerns.   I have spent a total of 25 minutes minutes of face-to-face and non-face-to-face time, preparing to see the patient, performing a medically appropriate examination, counseling and educating the patient, ordering medications, documenting clinical information in the electronic health record,and care coordination.    Briant Cedar, PA-C Hematology and Oncology [facility] [place] P: [phone]

## 2021-02-05 ENCOUNTER — Encounter: Payer: Self-pay | Admitting: Internal Medicine

## 2021-02-05 ENCOUNTER — Ambulatory Visit (INDEPENDENT_AMBULATORY_CARE_PROVIDER_SITE_OTHER): Payer: Federal, State, Local not specified - PPO | Admitting: Internal Medicine

## 2021-02-05 ENCOUNTER — Other Ambulatory Visit: Payer: Self-pay

## 2021-02-05 VITALS — BP 106/62 | HR 63 | Temp 98.0°F | Ht 62.8 in | Wt 134.0 lb

## 2021-02-05 DIAGNOSIS — N951 Menopausal and female climacteric states: Secondary | ICD-10-CM | POA: Diagnosis not present

## 2021-02-05 DIAGNOSIS — R350 Frequency of micturition: Secondary | ICD-10-CM | POA: Diagnosis not present

## 2021-02-05 DIAGNOSIS — L708 Other acne: Secondary | ICD-10-CM | POA: Diagnosis not present

## 2021-02-05 LAB — POCT URINALYSIS DIPSTICK
Bilirubin, UA: NEGATIVE
Glucose, UA: NEGATIVE
Ketones, UA: NEGATIVE
Nitrite, UA: POSITIVE
Protein, UA: NEGATIVE
Spec Grav, UA: 1.015 (ref 1.010–1.025)
Urobilinogen, UA: 0.2 E.U./dL
pH, UA: 6 (ref 5.0–8.0)

## 2021-02-05 NOTE — Patient Instructions (Signed)
Acne °Acne is a skin problem that causes small, red bumps (pimples) and other skin changes. The skin has tiny holes called pores. Each pore has an oil gland. Acne happens when the pores get blocked. The pores may become red, sore, and swollen. They may also become infected. Acne is common among teenagers. Acne usually goes away over time. °What are the causes? °This condition may be caused when: °Oil glands get blocked by oil, dead skin cells, and dirt. °Bacteria that live in the oil glands increase in number and cause infection. °Acne can start with changes in hormones. These changes can occur: °When children mature into their teens (adolescence). °When women get their period (menstrual cycle). °When women are pregnant. °Some things can make acne worse. They include: °Cosmetics and hair products that have oil in them. °Stress. °Diseases that cause changes in hormones. °Some medicines. °Headbands, backpacks, or shoulder pads. °Being near certain oils and chemicals. °Foods that are high in sugars. These include dairy products, sweets, and chocolates. °What increases the risk? °You are more likely to develop this condition if: °You are a teenager. °You have a family history of acne. °What are the signs or symptoms? °Symptoms of this condition include: °Small, red bumps (pimples or papules). °Whiteheads. °Blackheads. °Small, pus-filled pimples (pustules). °Big, red pimples or pustules that feel tender. °Acne that is very bad can cause: °An abscess. This is an area that has pus. °Cysts. These are hard, painful sacs that have fluid. °Scars. These can happen after large pimples heal. °How is this treated? °Treatment for this condition depends on how bad your acne is. It may include: °Creams and lotions. These can: °Keep the pores of your skin open. °Prevent infections and swelling. °Medicines that treat infections (antibiotics). These can be put on your skin or taken as pills. °Pills that decrease the amount of oil in  your skin. °Birth control pills. °Light or laser treatments. °Shots of medicine into the areas with acne. °Chemicals that cause the skin to peel. °Surgery. °Follow these instructions at home: °Good skin care is the most important thing you can do to treat your acne. Take care of your skin as told by your doctor. You may be told to do these things: °Wash your skin gently at least two times each day. You should also wash your skin: °After you exercise. °Before you go to bed. °Use mild soap. °Use a water-based skin moisturizer after you wash your skin. °Use a sunscreen or sunblock with SPF 30 or greater. This is very important if you are using acne medicines. °Choose cosmetics that will not block your oil glands (are noncomedogenic). °Medicines °Take over-the-counter and prescription medicines only as told by your doctor. °If you were prescribed an antibiotic medicine, use it or take it as told by your doctor. Do not stop using the antibiotic even if your acne gets better. °General instructions °Keep your hair clean and off your face. Shampoo your hair on a regular basis. If you have oily hair, you may need to wash it every day. °Avoid wearing tight headbands or hats. °Avoid picking or squeezing your pimples. That can make your acne worse and cause it to scar. °Shave gently. Only shave when you have to. °Keep a food journal. This can help you see if any foods are linked to your acne. °Keep all follow-up visits as told by your doctor. This is important. °Contact a doctor if: °Your acne is not better after eight weeks. °Your acne gets worse. °You have   a large area of skin that is red or tender. °You think that you are having side effects from any acne medicine. °Summary °Acne is a skin problem that causes pimples. Acne is common among teenagers. Acne usually goes away over time. °Acne starts with changes in your hormones. Other causes include stress, diet, and some medicines. °Follow your doctor's instructions on how to  take care of your skin. Good skin care is the most important thing you can do to treat your acne. °Take over-the-counter and prescription medicines only as told by your doctor. °Contact your doctor if you think that you are having side effects from any acne medicine. °This information is not intended to replace advice given to you by your health care provider. Make sure you discuss any questions you have with your health care provider. °Document Revised: 07/14/2017 Document Reviewed: 07/14/2017 °Elsevier Patient Education © 2022 Elsevier Inc. ° °

## 2021-02-05 NOTE — Progress Notes (Signed)
I,Tianna Badgett,acting as a Neurosurgeon for Gwynneth Aliment, MD.,have documented all relevant documentation on the behalf of Gwynneth Aliment, MD,as directed by  Gwynneth Aliment, MD while in the presence of Gwynneth Aliment, MD.  This visit occurred during the SARS-CoV-2 public health emergency.  Safety protocols were in place, including screening questions prior to the visit, additional usage of staff PPE, and extensive cleaning of exam room while observing appropriate contact time as indicated for disinfecting solutions.  Subjective:     Patient ID: Anna Powell , female    DOB: 1971/02/01 , 50 y.o.   MRN: [id number]   Chief Complaint  Patient presents with   Rash    HPI  Patient is here today for an evaluation of her acne. She thinks that this is due to hormonal changes.  She reports her sx started back in July. She has noticed that she is having more frequent breakouts. She states her breakouts are different, she has larger pimples than usual. She has started to use micellar water, along with tea tree oil. Additionally, she has been experiencing hot flashes, night sweats, brain fog and irritability.   Rash This is a new problem. The current episode started more than 1 month ago. The affected locations include the face. The rash is characterized by redness and swelling. She was exposed to nothing.    Past Medical History:  Diagnosis Date   Bacterial vaginosis    Breast density 2008   right breast    Constipation    Gestational thrombocytopenia (HCC)    ITP (idiopathic thrombocytopenic purpura)      Family History  Problem Relation Age of Onset   Hypertension Maternal Grandmother    Cancer Paternal Grandfather        lung   Heart disease Other        maternal great-grandmother heart attack    Hypertension Mother    Non-Hodgkin's lymphoma Father      Current Outpatient Medications:    Celecoxib (CELEBREX PO), As needed, Disp: , Rfl:    cetirizine-pseudoephedrine  (ZYRTEC-D) 5-120 MG tablet, Take 1 tablet by mouth 2 (two) times daily., Disp: , Rfl:    cyclobenzaprine (FLEXERIL) 5 MG tablet, Take 1 tablet (5 mg total) by mouth 3 (three) times daily as needed for muscle spasms., Disp: 30 tablet, Rfl: 0   ferrous sulfate 325 (65 FE) MG EC tablet, Take 1 tablet (325 mg total) by mouth daily with breakfast., Disp: 30 tablet, Rfl: 3   fluticasone (FLONASE) 50 MCG/ACT nasal spray, fluticasone propionate 50 mcg/actuation nasal spray,suspension  USE 2 SPRAYS IN EACH NOSTRIL EVERY DAY AT NIGHT, Disp: , Rfl:    levocetirizine (XYZAL) 5 MG tablet, levocetirizine 5 mg tablet, Disp: , Rfl:    linaclotide (LINZESS) 145 MCG CAPS capsule, Take 145 mcg by mouth daily before breakfast. As needed, Disp: , Rfl:    NOREL AD 4-10-325 MG TABS, Take 1 tablet by mouth 2 times per day, Disp: 20 tablet, Rfl: 0   Allergies  Allergen Reactions   Monistat [Miconazole]      Review of Systems  Constitutional: Negative.   Respiratory: Negative.    Cardiovascular: Negative.   Skin:  Positive for rash.  Neurological: Negative.     Today's Vitals   02/05/21 1430  BP: 106/62  Pulse: 63  Temp: 98 F (36.7 C)  TempSrc: Oral  Weight: 134 lb (60.8 kg)  Height: 5' 2.8" (1.595 m)   Body mass index is 23.89  kg/m.   Objective:  Physical Exam Vitals and nursing note reviewed.  Constitutional:      Appearance: Normal appearance.  HENT:     Head: Normocephalic and atraumatic.     Nose:     Comments: Masked     Mouth/Throat:     Comments: Masked  Eyes:     Extraocular Movements: Extraocular movements intact.  Cardiovascular:     Rate and Rhythm: Normal rate and regular rhythm.     Heart sounds: Normal heart sounds.  Pulmonary:     Effort: Pulmonary effort is normal.     Breath sounds: Normal breath sounds.  Musculoskeletal:     Cervical back: Normal range of motion.  Skin:    General: Skin is warm.     Findings: Acne present.     Comments: No pustular lesions noted.  Acne located on b/l cheeks.   Neurological:     General: No focal deficit present.     Mental Status: She is alert.  Psychiatric:        Mood and Affect: Mood normal.        Behavior: Behavior normal.        Assessment And Plan:     1. Other acne Comments: Her sx are likely related to her perimenopausal state. Advised to consider changing to Cetaphil/Cerave cleanser and incorporate zinc supplementation.   2. Perimenopause Comments: Pt advised she may benefit from Remifemin supplementation.  - Testosterone, Total  3. Urinary frequency Comments: I will check urinalysis today.  - POCT Urinalysis Dipstick ([id number])   Patient was given opportunity to ask questions. Patient verbalized understanding of the plan and was able to repeat key elements of the plan. All questions were answered to their satisfaction.   I, Gwynneth Aliment, MD, have reviewed all documentation for this visit. The documentation on 02/05/21 for the exam, diagnosis, procedures, and orders are all accurate and complete.   IF YOU HAVE BEEN REFERRED TO A SPECIALIST, IT MAY TAKE 1-2 WEEKS TO SCHEDULE/PROCESS THE REFERRAL. IF YOU HAVE NOT HEARD FROM US/SPECIALIST IN TWO WEEKS, PLEASE GIVE [place] A CALL AT [phone] X [phone].   THE PATIENT IS ENCOURAGED TO PRACTICE SOCIAL DISTANCING DUE TO THE COVID-19 PANDEMIC.

## 2021-02-06 ENCOUNTER — Ambulatory Visit: Payer: Federal, State, Local not specified - PPO | Admitting: Internal Medicine

## 2021-02-06 ENCOUNTER — Other Ambulatory Visit: Payer: Self-pay

## 2021-02-06 DIAGNOSIS — R350 Frequency of micturition: Secondary | ICD-10-CM | POA: Diagnosis not present

## 2021-02-06 LAB — TESTOSTERONE: Testosterone: 6 ng/dL (ref 4–50)

## 2021-02-10 LAB — URINE CULTURE

## 2021-02-11 ENCOUNTER — Encounter: Payer: Self-pay | Admitting: Internal Medicine

## 2021-03-27 ENCOUNTER — Encounter: Payer: Federal, State, Local not specified - PPO | Admitting: Internal Medicine

## 2021-04-09 ENCOUNTER — Ambulatory Visit: Payer: Federal, State, Local not specified - PPO | Admitting: Internal Medicine

## 2021-04-09 ENCOUNTER — Other Ambulatory Visit: Payer: Self-pay

## 2021-04-09 ENCOUNTER — Encounter: Payer: Self-pay | Admitting: Internal Medicine

## 2021-04-09 VITALS — BP 110/72 | HR 68 | Temp 97.9°F | Ht 62.8 in | Wt 136.0 lb

## 2021-04-09 DIAGNOSIS — L708 Other acne: Secondary | ICD-10-CM | POA: Diagnosis not present

## 2021-04-09 DIAGNOSIS — L299 Pruritus, unspecified: Secondary | ICD-10-CM

## 2021-04-09 DIAGNOSIS — D696 Thrombocytopenia, unspecified: Secondary | ICD-10-CM | POA: Diagnosis not present

## 2021-04-09 DIAGNOSIS — Z6824 Body mass index (BMI) 24.0-24.9, adult: Secondary | ICD-10-CM

## 2021-04-09 DIAGNOSIS — R21 Rash and other nonspecific skin eruption: Secondary | ICD-10-CM

## 2021-04-09 MED ORDER — TRETINOIN 0.01 % EX GEL: Freq: Every evening | CUTANEOUS | 0 refills | Status: DC | PRN

## 2021-04-09 NOTE — Progress Notes (Signed)
Jeri Cos [place],acting as a Neurosurgeon for Gwynneth Aliment, MD.,have documented all relevant documentation on the behalf of Gwynneth Aliment, MD,as directed by  Gwynneth Aliment, MD while in the presence of Gwynneth Aliment, MD.  This visit occurred during the SARS-CoV-2 public health emergency.  Safety protocols were in place, including screening questions prior to the visit, additional usage of staff PPE, and extensive cleaning of exam room while observing appropriate contact time as indicated for disinfecting solutions.  Subjective:     Patient ID: Anna Powell , female    DOB: 07-11-1970 , 51 y.o.   MRN: [id number]   Chief Complaint  Patient presents with   Rash    HPI  She is here today for further evaluation of a rash. She states she noticed rash on neck/chest this past Saturday. It is described as small bumps, no scales, no blisters. It does not itch. However, she does itch at other places. She is not aware of eating anything out of the ordinary. She has also noticed more facial acne, which is unusual for her. She cleanses her face with Cetaphil daily and uses micellar water nightly. She does admit that her diet has changed slightly, she has incorporated more fruit/veggie smoothies into her diet.   Rash This is a new problem. The current episode started in the past 7 days. The problem is unchanged. The affected locations include the face, chest and neck. She was exposed to nothing. Treatments tried: cerave cleansing wash, vitamin E, aloe vera mositurizer,differin and triamincolone. The treatment provided no relief.    Past Medical History:  Diagnosis Date   Bacterial vaginosis    Breast density 2008   right breast    Constipation    Gestational thrombocytopenia (HCC)    ITP (idiopathic thrombocytopenic purpura)      Family History  Problem Relation Age of Onset   Hypertension Maternal Grandmother    Cancer Paternal Grandfather        lung   Heart disease Other         maternal great-grandmother heart attack    Hypertension Mother    Non-Hodgkin's lymphoma Father      Current Outpatient Medications:    Celecoxib (CELEBREX PO), As needed, Disp: , Rfl:    cetirizine-pseudoephedrine (ZYRTEC-D) 5-120 MG tablet, Take 1 tablet by mouth 2 (two) times daily., Disp: , Rfl:    cyclobenzaprine (FLEXERIL) 5 MG tablet, Take 1 tablet (5 mg total) by mouth 3 (three) times daily as needed for muscle spasms., Disp: 30 tablet, Rfl: 0   ferrous sulfate 325 (65 FE) MG EC tablet, Take 1 tablet (325 mg total) by mouth daily with breakfast., Disp: 30 tablet, Rfl: 3   fluticasone (FLONASE) 50 MCG/ACT nasal spray, fluticasone propionate 50 mcg/actuation nasal spray,suspension  USE 2 SPRAYS IN EACH NOSTRIL EVERY DAY AT NIGHT, Disp: , Rfl:    levocetirizine (XYZAL) 5 MG tablet, levocetirizine 5 mg tablet, Disp: , Rfl:    linaclotide (LINZESS) 145 MCG CAPS capsule, Take 145 mcg by mouth daily before breakfast. As needed, Disp: , Rfl:    NOREL AD 4-10-325 MG TABS, Take 1 tablet by mouth 2 times per day, Disp: 20 tablet, Rfl: 0   tretinoin (RETIN-A) 0.01 % gel, Apply topically at bedtime as needed., Disp: 45 g, Rfl: 0   Allergies  Allergen Reactions   Monistat [Miconazole]      Review of Systems  Constitutional: Negative.   Respiratory: Negative.    Cardiovascular: Negative.  Gastrointestinal: Negative.   Skin:  Positive for rash.       She states her rash does not itch; however, she does experience generalized bodily itching. She is not sure what triggers her sx.   Neurological: Negative.   Psychiatric/Behavioral: Negative.      Today's Vitals   04/09/21 1511  BP: 110/72  Pulse: 68  Temp: 97.9 F (36.6 C)  Weight: 136 lb (61.7 kg)  Height: 5' 2.8" (1.595 m)  PainSc: 0-No pain   Body mass index is 24.25 kg/m.  Wt Readings from Last 3 Encounters:  04/09/21 136 lb (61.7 kg)  02/05/21 134 lb (60.8 kg)  01/11/21 134 lb 1 oz (60.8 kg)     Objective:  Physical  Exam Vitals and nursing note reviewed.  Constitutional:      Appearance: Normal appearance.  HENT:     Head: Normocephalic and atraumatic.     Nose:     Comments: Masked     Mouth/Throat:     Comments: Masked  Eyes:     Extraocular Movements: Extraocular movements intact.  Cardiovascular:     Rate and Rhythm: Normal rate and regular rhythm.     Heart sounds: Normal heart sounds.  Pulmonary:     Effort: Pulmonary effort is normal.     Breath sounds: Normal breath sounds.  Musculoskeletal:     Cervical back: Normal range of motion.  Skin:    General: Skin is warm.     Capillary Refill: Capillary refill takes more than 3 seconds.     Comments: Facial acne - more pronounced perioral and in chin area.   Maculopapular, flesh-colored rash on chest, posterior neck - no surrounding erythema. No vesicular lesions noted.   Neurological:     General: No focal deficit present.     Mental Status: She is alert.  Psychiatric:        Mood and Affect: Mood normal.        Behavior: Behavior normal.        Assessment And Plan:     1. Rash and nonspecific skin eruption Comments: I will check food allergies. She has had some food sensitivity testing in the past. There is a possibility that her gut is not tolerating fruit/veggie smoothies. There is a possibility her sx are due to perimenopause - encouraged to continue with her clean eating regimen.   2. Pruritus Comments: I will check labs as below, including food allergy panel. I did review ingredients of her new protein powder, Orgain. IT does include sunflower oil which can be inflammatory. She may need to avoid using it for two weeks, then see if her sx improve.   - ANA, IFA (with reflex) - Liver Profile - CBC with Diff  3. Other acne Comments: I will send rx Retin-A to apply to affected area nightly prn.   4. Thrombocytopenia (HCC) Comments: This has improved, I appreciate Hematology input I will check CBC w/ diff and forward to  Hematology as requested.   5. BMI 24.0-24.9, adult Comments: Her weight is stable.    Patient was given opportunity to ask questions. Patient verbalized understanding of the plan and was able to repeat key elements of the plan. All questions were answered to their satisfaction.   I, Gwynneth Aliment, MD, have reviewed all documentation for this visit. The documentation on 04/09/21 for the exam, diagnosis, procedures, and orders are all accurate and complete.   IF YOU HAVE BEEN REFERRED TO A SPECIALIST, IT MAY  TAKE 1-2 WEEKS TO SCHEDULE/PROCESS THE REFERRAL. IF YOU HAVE NOT HEARD FROM US/SPECIALIST IN TWO WEEKS, PLEASE GIVE [place] A CALL AT [phone].   THE PATIENT IS ENCOURAGED TO PRACTICE SOCIAL DISTANCING DUE TO THE COVID-19 PANDEMIC.

## 2021-04-10 DIAGNOSIS — L299 Pruritus, unspecified: Secondary | ICD-10-CM | POA: Diagnosis not present

## 2021-04-12 ENCOUNTER — Other Ambulatory Visit: Payer: Self-pay

## 2021-04-12 ENCOUNTER — Inpatient Hospital Stay: Payer: Federal, State, Local not specified - PPO | Attending: Physician Assistant

## 2021-04-12 ENCOUNTER — Inpatient Hospital Stay: Payer: Federal, State, Local not specified - PPO | Admitting: Physician Assistant

## 2021-04-12 VITALS — BP 110/73 | HR 57 | Temp 97.3°F | Resp 100 | Wt 140.1 lb

## 2021-04-12 DIAGNOSIS — D649 Anemia, unspecified: Secondary | ICD-10-CM | POA: Diagnosis not present

## 2021-04-12 DIAGNOSIS — D508 Other iron deficiency anemias: Secondary | ICD-10-CM

## 2021-04-12 DIAGNOSIS — D696 Thrombocytopenia, unspecified: Secondary | ICD-10-CM | POA: Diagnosis not present

## 2021-04-12 DIAGNOSIS — R7989 Other specified abnormal findings of blood chemistry: Secondary | ICD-10-CM | POA: Diagnosis not present

## 2021-04-12 LAB — COMPREHENSIVE METABOLIC PANEL
ALT: 19 U/L (ref 0–44)
AST: 25 U/L (ref 15–41)
Albumin: 4.6 g/dL (ref 3.5–5.0)
Alkaline Phosphatase: 57 U/L (ref 38–126)
Anion gap: 5 (ref 5–15)
BUN: 17 mg/dL (ref 6–20)
CO2: 30 mmol/L (ref 22–32)
Calcium: 10 mg/dL (ref 8.9–10.3)
Chloride: 104 mmol/L (ref 98–111)
Creatinine, Ser: 1.3 mg/dL — ABNORMAL HIGH (ref 0.44–1.00)
GFR, Estimated: 49 mL/min — ABNORMAL LOW (ref >60)
Glucose, Bld: 90 mg/dL (ref 70–99)
Potassium: 4 mmol/L (ref 3.5–5.1)
Sodium: 139 mmol/L (ref 135–145)
Total Bilirubin: 0.4 mg/dL (ref 0.3–1.2)
Total Protein: 7.7 g/dL (ref 6.5–8.1)

## 2021-04-12 LAB — CBC WITH DIFFERENTIAL (CANCER CENTER ONLY)
Abs Immature Granulocytes: 0.01 10*3/uL (ref 0.00–0.07)
Basophils Absolute: 0 10*3/uL (ref 0.0–0.1)
Basophils Relative: 1 %
Eosinophils Absolute: 0 10*3/uL (ref 0.0–0.5)
Eosinophils Relative: 1 %
HCT: 39.1 % (ref 36.0–46.0)
Hemoglobin: 12.5 g/dL (ref 12.0–15.0)
Immature Granulocytes: 0 %
Lymphocytes Relative: 33 %
Lymphs Abs: 1.9 10*3/uL (ref 0.7–4.0)
MCH: 28.3 pg (ref 26.0–34.0)
MCHC: 32 g/dL (ref 30.0–36.0)
MCV: 88.7 fL (ref 80.0–100.0)
Monocytes Absolute: 0.4 10*3/uL (ref 0.1–1.0)
Monocytes Relative: 8 %
Neutro Abs: 3.3 10*3/uL (ref 1.7–7.7)
Neutrophils Relative %: 57 %
Platelet Count: 145 10*3/uL — ABNORMAL LOW (ref 150–400)
RBC: 4.41 MIL/uL (ref 3.87–5.11)
RDW: 12.7 % (ref 11.5–15.5)
WBC Count: 5.7 10*3/uL (ref 4.0–10.5)
nRBC: 0 % (ref 0.0–0.2)

## 2021-04-12 LAB — IRON AND IRON BINDING CAPACITY (CC-WL,HP ONLY)
Iron: 111 ug/dL (ref 28–170)
Saturation Ratios: 34 % — ABNORMAL HIGH (ref 10.4–31.8)
TIBC: 330 ug/dL (ref 250–450)
UIBC: 219 ug/dL (ref 148–442)

## 2021-04-12 LAB — RETIC PANEL
Immature Retic Fract: 5.4 % (ref 2.3–15.9)
RBC.: 4.52 MIL/uL (ref 3.87–5.11)
Retic Count, Absolute: 33.4 10*3/uL (ref 19.0–186.0)
Retic Ct Pct: 0.7 % (ref 0.4–3.1)
Reticulocyte Hemoglobin: 30.2 pg (ref >27.9)

## 2021-04-12 LAB — FERRITIN: Ferritin: 36 ng/mL (ref 11–307)

## 2021-04-12 NOTE — Progress Notes (Addendum)
Anna Powell  PROGRESS NOTE  Patient Care Team: Anna Peng, MD as PCP - General (Internal Medicine)  CHIEF COMPLAINTS/PURPOSE OF CONSULTATION:  -Thrombocytopenia -Normocytic anemia   INTERIM HISTORY:  Anna Powell 50 y.o. female returns for a follow up for thrombocytopenia and normocytic anemia. She is unaccompanied for this visit. She reports improvement of energy since the last visit. She continues to stay active and completes her daily activities on her own. Her dietary restrictions include avoiding red meat. She denies nausea, vomiting or abdominal pain. She has IBS and has occasional episodes of constipation. She takes Linzess 2-3 times per week to regular her bowel habits. She denies easy bruising or signs of bleeding. She reports hair thinning has slowed down. She denies any fevers, chills, night sweats, shortness of breath, chest pain or cough. She has no other complaints.  Rest of the pertinent 10 point ROS reviewed and negative.  REVIEW OF SYSTEMS:   Constitutional: Denies fevers, chills or abnormal night sweats Eyes: Denies blurriness of vision, double vision or watery eyes Ears, nose, mouth, throat, and face: Denies mucositis or sore throat Respiratory: Denies cough, dyspnea or wheezes Cardiovascular: Denies palpitation, chest discomfort or lower extremity swelling Gastrointestinal:  Denies nausea, heartburn or change in bowel habits Skin: Denies abnormal skin rashes Lymphatics: Denies new lymphadenopathy or easy bruising Neurological:Denies numbness, tingling or new weaknesses Behavioral/Psych: Mood is stable, no new changes    MEDICAL HISTORY:  Past Medical History:  Diagnosis Date   Bacterial vaginosis    Breast density 2008   right breast    Constipation    Gestational thrombocytopenia (HCC)    ITP (idiopathic thrombocytopenic purpura)     SURGICAL HISTORY: Past Surgical History:  Procedure Laterality Date   CESAREAN SECTION   02/18/2001   Dr. Normand Powell   ENDOMETRIAL BIOPSY  11/01/2009   TUBAL LIGATION  02/18/2001   Anna Powell    SOCIAL HISTORY: Social History   Socioeconomic History   Marital status: Married    Spouse name: Not on file   Number of children: Not on file   Years of education: Not on file   Highest education level: Not on file  Occupational History   Not on file  Tobacco Use   Smoking status: Never   Smokeless tobacco: Never  Vaping Use   Vaping Use: Never used  Substance and Sexual Activity   Alcohol use: No   Drug use: No   Sexual activity: Yes    Birth control/protection: Surgical    Comment: BTL  Other Topics Concern   Not on file  Social History Narrative   Not on file   Social Determinants of Health   Financial Resource Strain: Not on file  Food Insecurity: Not on file  Transportation Needs: Not on file  Physical Activity: Not on file  Stress: Not on file  Social Connections: Not on file  Intimate Partner Violence: Not on file    FAMILY HISTORY: Family History  Problem Relation Age of Onset   Hypertension Maternal Grandmother    Cancer Paternal Grandfather        lung   Heart disease Other        maternal great-grandmother heart attack    Hypertension Mother    Non-Hodgkin's lymphoma Father     ALLERGIES:  is allergic to monistat [miconazole].  MEDICATIONS:  Current Outpatient Medications  Medication Sig Dispense Refill   Celecoxib (CELEBREX PO) As needed     cetirizine-pseudoephedrine (ZYRTEC-D) 5-120 MG tablet Take  1 tablet by mouth 2 (two) times daily.     cyclobenzaprine (FLEXERIL) 5 MG tablet Take 1 tablet (5 mg total) by mouth 3 (three) times daily as needed for muscle spasms. 30 tablet 0   ferrous sulfate 325 (65 FE) MG EC tablet Take 1 tablet (325 mg total) by mouth daily with breakfast. 30 tablet 3   fluticasone (FLONASE) 50 MCG/ACT nasal spray fluticasone propionate 50 mcg/actuation nasal spray,suspension  USE 2 SPRAYS IN EACH NOSTRIL EVERY DAY AT  NIGHT     levocetirizine (XYZAL) 5 MG tablet levocetirizine 5 mg tablet     linaclotide (LINZESS) 145 MCG CAPS capsule Take 145 mcg by mouth daily before breakfast. As needed     NOREL AD 4-10-325 MG TABS Take 1 tablet by mouth 2 times per day 20 tablet 0   tretinoin (RETIN-A) 0.01 % gel Apply topically at bedtime as needed. 45 g 0   No current facility-administered medications for this visit.    PHYSICAL EXAMINATION: ECOG PERFORMANCE STATUS: 0 - Asymptomatic  Vitals:   04/12/21 1507  BP: 110/73  Pulse: (!) 57  Resp: (!) 100  Temp: (!) 97.3 F (36.3 C)  SpO2: 100%   Filed Weights   04/12/21 1507  Weight: 140 lb 1.6 oz (63.5 kg)    GENERAL:alert, no distress and comfortable SKIN: skin color, texture, turgor are normal, no rashes or significant lesions EYES: normal, conjunctiva are pink and non-injected, sclera clear OROPHARYNX:no exudate, no erythema and lips, buccal mucosa, and tongue normal  LUNGS: clear to auscultation and percussion with normal breathing effort HEART: regular rate & rhythm and no murmurs and no lower extremity edema ABDOMEN:abdomen soft, non-tender and normal bowel sounds Musculoskeletal:no cyanosis of digits and no clubbing  PSYCH: alert & oriented x 3 with fluent speech NEURO: no focal motor/sensory deficits  LABORATORY DATA:  I have reviewed the data as listed Lab Results  Component Value Date   WBC 5.7 04/12/2021   HGB 12.5 04/12/2021   HCT 39.1 04/12/2021   MCV 88.7 04/12/2021   PLT 145 (L) 04/12/2021     Chemistry      Component Value Date/Time   NA 141 12/26/2020 1435   NA 140 12/12/2020 1008   K 4.1 12/26/2020 1435   CL 106 12/26/2020 1435   CO2 29 12/26/2020 1435   BUN 16 12/26/2020 1435   BUN 17 12/12/2020 1008   CREATININE 1.01 (H) 12/26/2020 1435      Component Value Date/Time   CALCIUM 9.9 12/26/2020 1435   ALKPHOS 55 12/26/2020 1435   AST 27 12/26/2020 1435   ALT 24 12/26/2020 1435   BILITOT 0.3 12/26/2020 1435    BILITOT 0.5 12/12/2020 1008      RADIOGRAPHIC STUDIES: I have personally reviewed the radiological images as listed and agreed with the findings in the report. No results found.  ASSESSMENT & PLAN:  Anna Powell is a 51 y.o. female who returns for follow-up.   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  --Lab results from 04/12/2021, that showed stable platelet count of 145K, which is near normal.  --Additional diagnostic testing is not required at this time. Continue to monitor.   #Mild normocytic anemia --Borderline iron deficiency anemia,  --Labs today shows anemia has resolved with Hgb 12.5.  --Currently on ferrous sulfate 325 mg once daily. Advised to continue with a source of vitamin C.  --Continue to incorporate iron rich foods into diet.   #Elevated creatinine  levels: --Today's creatinine level is 1.30 mg/dL. Has oscillated over the past year --Encouraged adequate hydration. --Discussed results with PCP, Dr. Allyne GeeSanders, and request follow up to check levels in 4-6 weeks.   Follow up: 6 months: F/U visit and labs with Dr. Al PimpleIruku  All questions were answered. The patient knows to call the clinic with any problems, questions or concerns.   I have spent a total of 25 minutes minutes of face-to-face and non-face-to-face time, preparing to see the patient, performing a medically appropriate examination, counseling and educating the patient,documenting clinical information in the electronic health record,and care coordination.    Briant CedarIrene T Lenell Mcconnell, PA-C Hematology and Oncology [facility] P: [phone]

## 2021-04-15 ENCOUNTER — Other Ambulatory Visit: Payer: Self-pay | Admitting: Internal Medicine

## 2021-04-15 DIAGNOSIS — R7989 Other specified abnormal findings of blood chemistry: Secondary | ICD-10-CM

## 2021-04-16 ENCOUNTER — Telehealth: Payer: Self-pay

## 2021-04-16 LAB — CBC WITH DIFFERENTIAL/PLATELET
Basophils Absolute: 0 10*3/uL (ref 0.0–0.2)
Basos: 1 %
EOS (ABSOLUTE): 0.1 10*3/uL (ref 0.0–0.4)
Eos: 1 %
Hematocrit: 41.9 % (ref 34.0–46.6)
Hemoglobin: 13.3 g/dL (ref 11.1–15.9)
Immature Grans (Abs): 0 10*3/uL (ref 0.0–0.1)
Immature Granulocytes: 0 %
Lymphocytes Absolute: 2.1 10*3/uL (ref 0.7–3.1)
Lymphs: 37 %
MCH: 28.1 pg (ref 26.6–33.0)
MCHC: 31.7 g/dL (ref 31.5–35.7)
MCV: 88 fL (ref 79–97)
Monocytes Absolute: 0.4 10*3/uL (ref 0.1–0.9)
Monocytes: 8 %
Neutrophils Absolute: 3 10*3/uL (ref 1.4–7.0)
Neutrophils: 53 %
RBC: 4.74 x10E6/uL (ref 3.77–5.28)
RDW: 12.3 % (ref 11.7–15.4)
WBC: 5.6 10*3/uL (ref 3.4–10.8)

## 2021-04-16 LAB — HEPATIC FUNCTION PANEL
ALT: 19 IU/L (ref 0–32)
AST: 21 IU/L (ref 0–40)
Albumin: 5 g/dL — ABNORMAL HIGH (ref 3.8–4.9)
Alkaline Phosphatase: 71 IU/L (ref 44–121)
Bilirubin Total: 0.4 mg/dL (ref 0.0–1.2)
Bilirubin, Direct: 0.11 mg/dL (ref 0.00–0.40)
Total Protein: 7.9 g/dL (ref 6.0–8.5)

## 2021-04-16 LAB — ANTINUCLEAR ANTIBODIES, IFA: ANA Titer 1: NEGATIVE

## 2021-04-16 NOTE — Telephone Encounter (Signed)
Please notify patient that iron levels have improved. Recommend to continue on oral iron     LM X2 for pt to return my call

## 2021-04-16 NOTE — Telephone Encounter (Signed)
-----   Message from Lincoln Brigham, PA-C sent at 04/15/2021 10:47 AM EST ----- Please notify her that her creatinine has increased slightly. Encourage her to increase her hydration. We will request her PCP to follow up and repeat levels to monitor.

## 2021-04-21 ENCOUNTER — Other Ambulatory Visit: Payer: Self-pay | Admitting: Internal Medicine

## 2021-04-21 DIAGNOSIS — L708 Other acne: Secondary | ICD-10-CM

## 2021-04-21 DIAGNOSIS — R21 Rash and other nonspecific skin eruption: Secondary | ICD-10-CM

## 2021-05-01 ENCOUNTER — Encounter: Payer: Self-pay | Admitting: Physician Assistant

## 2021-05-17 DIAGNOSIS — L308 Other specified dermatitis: Secondary | ICD-10-CM | POA: Diagnosis not present

## 2021-05-17 DIAGNOSIS — R21 Rash and other nonspecific skin eruption: Secondary | ICD-10-CM | POA: Diagnosis not present

## 2021-05-27 ENCOUNTER — Other Ambulatory Visit: Payer: Federal, State, Local not specified - PPO

## 2021-05-27 DIAGNOSIS — R7989 Other specified abnormal findings of blood chemistry: Secondary | ICD-10-CM | POA: Diagnosis not present

## 2021-05-28 LAB — BMP8+EGFR
BUN/Creatinine Ratio: 14 (ref 9–23)
BUN: 14 mg/dL (ref 6–24)
CO2: 25 mmol/L (ref 20–29)
Calcium: 9.8 mg/dL (ref 8.7–10.2)
Chloride: 101 mmol/L (ref 96–106)
Creatinine, Ser: 1.02 mg/dL — ABNORMAL HIGH (ref 0.57–1.00)
Glucose: 74 mg/dL (ref 70–99)
Potassium: 4.4 mmol/L (ref 3.5–5.2)
Sodium: 140 mmol/L (ref 134–144)
eGFR: 66 mL/min/{1.73_m2} (ref >59)

## 2021-06-05 DIAGNOSIS — L920 Granuloma annulare: Secondary | ICD-10-CM | POA: Diagnosis not present

## 2021-06-07 ENCOUNTER — Telehealth: Payer: Self-pay

## 2021-06-07 NOTE — Telephone Encounter (Signed)
Your kidney function has improved. Have you made any changes in the supplements you have been using?  ?RS ? ?The pt said yes she is now only taking the Hair, skin, nails,  and the iron she stopped the zinc and digestive enzymes. She has also increased her water intake.  ?

## 2021-06-10 ENCOUNTER — Other Ambulatory Visit: Payer: Self-pay | Admitting: Internal Medicine

## 2021-06-10 DIAGNOSIS — L299 Pruritus, unspecified: Secondary | ICD-10-CM

## 2021-06-10 DIAGNOSIS — R21 Rash and other nonspecific skin eruption: Secondary | ICD-10-CM

## 2021-07-18 DIAGNOSIS — L920 Granuloma annulare: Secondary | ICD-10-CM | POA: Diagnosis not present

## 2021-08-12 ENCOUNTER — Emergency Department (HOSPITAL_COMMUNITY)
Admission: EM | Admit: 2021-08-12 | Discharge: 2021-08-12 | Disposition: A | Discharge status: Home / Self Care | Payer: Federal, State, Local not specified - PPO | Attending: Emergency Medicine | Admitting: Emergency Medicine

## 2021-08-12 ENCOUNTER — Encounter (HOSPITAL_COMMUNITY): Payer: Self-pay | Admitting: Emergency Medicine

## 2021-08-12 ENCOUNTER — Other Ambulatory Visit: Payer: Self-pay | Admitting: Internal Medicine

## 2021-08-12 ENCOUNTER — Emergency Department (HOSPITAL_COMMUNITY): Payer: Federal, State, Local not specified - PPO

## 2021-08-12 DIAGNOSIS — S0993XA Unspecified injury of face, initial encounter: Secondary | ICD-10-CM | POA: Insufficient documentation

## 2021-08-12 DIAGNOSIS — S022XXA Fracture of nasal bones, initial encounter for closed fracture: Secondary | ICD-10-CM | POA: Insufficient documentation

## 2021-08-12 DIAGNOSIS — W010XXA Fall on same level from slipping, tripping and stumbling without subsequent striking against object, initial encounter: Secondary | ICD-10-CM | POA: Insufficient documentation

## 2021-08-12 DIAGNOSIS — S0031XA Abrasion of nose, initial encounter: Secondary | ICD-10-CM | POA: Diagnosis not present

## 2021-08-12 DIAGNOSIS — S0501XA Injury of conjunctiva and corneal abrasion without foreign body, right eye, initial encounter: Secondary | ICD-10-CM | POA: Insufficient documentation

## 2021-08-12 DIAGNOSIS — R519 Headache, unspecified: Secondary | ICD-10-CM | POA: Diagnosis not present

## 2021-08-12 DIAGNOSIS — S0992XA Unspecified injury of nose, initial encounter: Secondary | ICD-10-CM | POA: Diagnosis not present

## 2021-08-12 MED ORDER — TRAMADOL HCL 50 MG PO TABS: 50.0000 mg | ORAL_TABLET | Freq: Four times a day (QID) | ORAL | 0 refills | Status: DC | PRN

## 2021-08-12 NOTE — ED Provider Notes (Cosign Needed Addendum)
[facility]-EMERGENCY DEPT Provider Note   CSN: [id number] Arrival date & time: 08/12/21  1158     History  Chief Complaint  Patient presents with   Facial Pain    Anna Powell is a 51 y.o. female with medical history of ITP, constipation.  Patient presents ED for evaluation of facial injury.  Patient reports that prior to arrival, she was at home doing laundry when the laundry detergent spilled onto the floor causing her to lose her footing and slipped.  The patient states that she struck the right side of her face on the washing machine.  The patient denies blood thinners, denies loss of consciousness.  Patient reports that she can remember the events before and after the event.  Patient complaining of facial pain.  Patient denies nausea, vomiting, headache, neck pain, loss of consciousness, weakness.  HPI     Home Medications Prior to Admission medications   Medication Sig Start Date End Date Taking? Authorizing Provider  Celecoxib (CELEBREX PO) As needed    [provider]  cetirizine-pseudoephedrine (ZYRTEC-D) 5-120 MG tablet Take 1 tablet by mouth 2 (two) times daily.    [provider]  cyclobenzaprine (FLEXERIL) 5 MG tablet Take 1 tablet (5 mg total) by mouth 3 (three) times daily as needed for muscle spasms. 09/03/20   Dorothyann Peng, MD  ferrous sulfate 325 (65 FE) MG EC tablet Take 1 tablet (325 mg total) by mouth daily with breakfast. 01/11/21   Georga Kaufmann T, PA-C  fluticasone (FLONASE) 50 MCG/ACT nasal spray fluticasone propionate 50 mcg/actuation nasal spray,suspension  USE 2 SPRAYS IN EACH NOSTRIL EVERY DAY AT NIGHT    [provider]  levocetirizine (XYZAL) 5 MG tablet levocetirizine 5 mg tablet    [provider]  linaclotide (LINZESS) 145 MCG CAPS capsule Take 145 mcg by mouth daily before breakfast. As needed    [provider]  NOREL AD 4-10-325 MG TABS Take 1 tablet by mouth 2 times per  day 07/06/20   Dorothyann Peng, MD  tretinoin (RETIN-A) 0.01 % gel Apply topically at bedtime as needed. 04/09/21   Dorothyann Peng, MD      Allergies    Monistat [miconazole]    Review of Systems   Review of Systems  HENT:         Facial pain  Gastrointestinal:  Negative for nausea and vomiting.  Musculoskeletal:  Negative for back pain and neck pain.  Neurological:  Negative for syncope, weakness and headaches.  All other systems reviewed and are negative.  Physical Exam Updated Vital Signs BP 125/77   Pulse 66   Temp 98.3 F (36.8 C) (Oral)   Resp 18   Ht 5\' 3"  (1.6 m)   Wt 61.2 kg   LMP  (LMP Unknown)   SpO2 99%   BMI 23.91 kg/m  Physical Exam Vitals and nursing note reviewed.  Constitutional:      General: She is not in acute distress.    Appearance: She is not ill-appearing, toxic-appearing or diaphoretic.  HENT:     Head: Normocephalic.     Comments: Abrasion noted over patient right eye.    Nose:     Comments: Abrasion noted to bridge of patient nose, no bleeding    Mouth/Throat:     Mouth: Mucous membranes are moist.     Pharynx: Oropharynx is clear.  Eyes:     General: Lids are normal.     Extraocular Movements: Extraocular movements intact.  Right eye: Normal extraocular motion.     Left eye: Normal extraocular motion.     Conjunctiva/sclera: Conjunctivae normal.     Pupils: Pupils are equal, round, and reactive to light.     Comments: Patient EOMs intact.  No painful eye movement.  Cardiovascular:     Rate and Rhythm: Normal rate and regular rhythm.  Pulmonary:     Effort: Pulmonary effort is normal.     Breath sounds: Normal breath sounds. No wheezing.  Abdominal:     General: Abdomen is flat. Bowel sounds are normal.     Palpations: Abdomen is soft.     Tenderness: There is no abdominal tenderness.  Musculoskeletal:     Cervical back: Normal range of motion and neck supple. No rigidity or tenderness.  Skin:    General: Skin is warm and dry.      Capillary Refill: Capillary refill takes less than 2 seconds.  Neurological:     General: No focal deficit present.     Mental Status: She is alert and oriented to person, place, and time.     GCS: GCS eye subscore is 4. GCS verbal subscore is 5. GCS motor subscore is 6.     Cranial Nerves: Cranial nerves 2-12 are intact. No cranial nerve deficit.     Sensory: Sensation is intact. No sensory deficit.     Motor: Motor function is intact. No weakness.     Coordination: Coordination is intact. Heel to [facility] Test normal.     Comments: Patient follows commands appropriately.    ED Results / Procedures / Treatments   Labs (all labs ordered are listed, but only abnormal results are displayed) Labs Reviewed - No data to display  EKG None  Radiology CT Maxillofacial Wo Contrast  Result Date: 08/12/2021 CLINICAL DATA:  Right facial pain, injury EXAM: CT MAXILLOFACIAL WITHOUT CONTRAST TECHNIQUE: Multidetector CT imaging of the maxillofacial structures was performed. Multiplanar CT image reconstructions were also generated. RADIATION DOSE REDUCTION: This exam was performed according to the departmental dose-optimization program which includes automated exposure control, adjustment of the mA and/or kV according to patient size and/or use of iterative reconstruction technique. COMPARISON:  None Available. FINDINGS: Osseous: Acute mildly depressed right nasal bone fracture. No additional acute maxillofacial bone fracture. Bony orbital walls are intact. Mandible intact. Temporomandibular joints are aligned without dislocation. Orbits: Negative. No traumatic or inflammatory finding. Sinuses: Paranasal sinuses are clear. Soft tissues: Right maxillary and nasal soft tissue swelling. Limited intracranial: No significant or unexpected finding. IMPRESSION: Acute mildly depressed right nasal bone fracture. Electronically Signed   By: Duanne GuessNicholas  Plundo D.O.   On: 08/12/2021 16:45    Procedures Procedures     Medications Ordered in ED Medications - No data to display  ED Course/ Medical Decision Making/ A&P                           Medical Decision Making Amount and/or Complexity of Data Reviewed Radiology: ordered.   51 year old female presents to the ED for evaluation.  Please see HPI for further details.  On examination, the patient is afebrile, nontachycardic.  Patient lung sounds clear bilaterally, patient nonhypoxic on room air.  Patient abdomen soft and compressible all 4 quadrants.  Patient does have abrasions to her right eyelid, bridge of nose.  Dermabond placed over patient abrasion over right eye.  Patient worked up utilizing the following imaging studies to include CT maxillofacial.  CT scan shows nondisplaced  right nasal bone fracture.  The patient will be advised to not use straws, not blow her nose.  The patient will be referred to ENT for further follow-up and management.  Patient given return precautions and she voiced understanding.  Patient had all of her questions answered to her satisfaction.  The patient is stable this time for discharge home.  Final Clinical Impression(s) / ED Diagnoses Final diagnoses:  Closed fracture of nasal bone, initial encounter    Rx / DC Orders ED Discharge Orders     None             Al Decant, PA-C 08/12/21 1652    Gloris Manchester, MD 08/13/21 [phone]

## 2021-08-12 NOTE — Discharge Instructions (Addendum)
Avoid straw use, avoid blowing nose Please read attached guide concerning ankle fractures You may utilize ice packs at home as well as ibuprofen and Tylenol Please follow-up with the ENT doctor I referred her to

## 2021-08-12 NOTE — ED Notes (Signed)
Patient provided with ice pack

## 2021-08-12 NOTE — ED Triage Notes (Signed)
Patient reports slip and fall in laundry room today hitting R face on washing machine. C/o pain to R face surrounding R eye and nose. Denies blood thinner and LOC.

## 2021-08-14 ENCOUNTER — Telehealth: Payer: Self-pay

## 2021-08-14 NOTE — Telephone Encounter (Signed)
Transition Care Management Follow-up Telephone Call Date of discharge and from where: 08/12/21 How have you been since you were released from the hospital? fine Any questions or concerns? No   Items Reviewed: Did the pt receive and understand the discharge instructions provided? Yes  Medications obtained and verified? Yes  Any new allergies since your discharge? No  Dietary orders reviewed? No Do you have support at home? Yes  Other (ie: DME, Home Health, etc) NA  Functional Questionnaire: (I = Independent and D = Dependent)  Bathing/Dressing- i   Meal Prep- i  Eating- i  Maintaining continence- i  Transferring/Ambulation- i  Managing Meds- i   Follow up appointments reviewed:   PCP Hospital f/u appt confirmed? Yes   Specialist Hospital f/u appt confirmed? Yes  scheduled to see Dr Redmond Baseman on 08/16/2021. Are transportation arrangements needed? Yes  If their condition worsens, is the pt aware to call  their PCP or go to the ED? Yes Was the patient provided with contact information for the PCP's office or ED? Yes Was the pt encouraged to call back with questions or concerns? Yes

## 2021-08-15 ENCOUNTER — Ambulatory Visit: Payer: Federal, State, Local not specified - PPO | Admitting: Allergy

## 2021-08-15 ENCOUNTER — Encounter: Payer: Self-pay | Admitting: Allergy

## 2021-08-15 VITALS — BP 110/70 | HR 68 | Temp 98.3°F | Resp 16 | Ht 62.0 in | Wt 140.0 lb

## 2021-08-15 DIAGNOSIS — R21 Rash and other nonspecific skin eruption: Secondary | ICD-10-CM | POA: Diagnosis not present

## 2021-08-15 DIAGNOSIS — J3089 Other allergic rhinitis: Secondary | ICD-10-CM

## 2021-08-15 DIAGNOSIS — T781XXA Other adverse food reactions, not elsewhere classified, initial encounter: Secondary | ICD-10-CM

## 2021-08-15 DIAGNOSIS — K9049 Malabsorption due to intolerance, not elsewhere classified: Secondary | ICD-10-CM

## 2021-08-15 NOTE — Patient Instructions (Addendum)
-  environmental allergy testing is positive to dust mites.  -allergen avoidance measures provided -common food allergy testing is negative -do not believe rash or gastrointestinal symptoms with ingestion of peanut or egg products represent an IgE mediated food allergy but could represent a food intolerance.  -for allergy symptom control (ie. Nasal congestion, drainage, sneezing, itchy/watery eyes etc) can take long-acting antihistamine Zyrtec, Allegra or Xyzal daily as needed.  May use nasal steroid sprays like Flonase for congestion or Astepro for drainage.  For itchy/watery eyes can use Pataday eye drops.  All of these are over-the-counter medications.   -continue current topical medication regimen as directed by dermatology  Follow-up as needed

## 2021-08-15 NOTE — Progress Notes (Signed)
New Patient Note  RE: Anna Powell MRN: [id number] DOB: 1970/12/12 Date of Office Visit: 08/15/2021   Primary care provider: Dorothyann Peng, MD  Chief Complaint: Rash  History of present illness: Anna Powell is a 51 y.o. female presenting today for evaluation of rash.  She states she has lingering rash.  She states she thought it was something she was eating causing the rash at first.  However the rash has not gone away.  The rash first started on her face around Oct 2022. The rash has been on her face, neck, back, chest.  The rash can be itchy.  Rash is small bumps in clusters.  No blistering, oozing or no scaling.  She has seen her PCP for this rash back in January 2023. They were both wondering if there could be a food component to this rash therefore her coming to see me today.  Around the time of the rash onset she was doing a juice diet with protein powder. She has not noted any differences in the rash in different environments.  She has not had any changes to topical products or body products detergents. She has been to a dermatologist for this rash.  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   She has been recommended to use clobetasol for body and triamcinolone on the face.   She states if she eats peanuts or eggs it does make her stomach hurt with cramping and bloating.  Thus she limits these foods.  She states she feels okay eating peanut butter but Peanuts cause issue.  She does report nasal congestion, drainage, ear clicking around the season changes in the early part year and may take zyrtec or similar antihistamine to help with control.    No history of of eczema or food allergy or asthma.   Of note she had a very recent fall on detergent and was seen in the ED on 08/12/2021 where she was found to have a closed nasal fracture.  She still has some swelling of the right side of her face.  Will be seeing ENT tomorrow.  Review of  systems: Review of Systems  Constitutional: Negative.   HENT: Negative.    Eyes: Negative.   Respiratory: Negative.    Cardiovascular: Negative.   Gastrointestinal: Negative.   Musculoskeletal: Negative.   Skin:  Positive for rash.  Allergic/Immunologic: Negative.   Neurological: Negative.    All other systems negative unless noted above in HPI  Past medical history: Past Medical History:  Diagnosis Date   Bacterial vaginosis    Breast density 2008   right breast    Constipation    Gestational thrombocytopenia (HCC)    ITP (idiopathic thrombocytopenic purpura)     Past surgical history: Past Surgical History:  Procedure Laterality Date   CESAREAN SECTION  02/18/2001   Dr. Normand Sloop   ENDOMETRIAL BIOPSY  11/01/2009   TUBAL LIGATION  02/18/2001   [facility]    Family history:  Family History  Problem Relation Age of Onset   Hypertension Maternal Grandmother    Cancer Paternal Grandfather        lung   Heart disease Other        maternal great-grandmother heart attack    Hypertension Mother    Non-Hodgkin's lymphoma Father     Social history: Sting a home with carpeting with gas heating and central cooling.  Dog.  There is no concern for water damage, mildew or roaches  in the home.  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  She denies a smoking history.   Medication List: Current Outpatient Medications  Medication Sig Dispense Refill   Celecoxib (CELEBREX PO) As needed (Patient not taking: Reported on 08/15/2021)     cetirizine-pseudoephedrine (ZYRTEC-D) 5-120 MG tablet Take 1 tablet by mouth 2 (two) times daily. (Patient not taking: Reported on 08/15/2021)     cyclobenzaprine (FLEXERIL) 5 MG tablet Take 1 tablet (5 mg total) by mouth 3 (three) times daily as needed for muscle spasms. (Patient not taking: Reported on 08/15/2021) 30 tablet 0   ferrous sulfate 325 (65 FE) MG EC tablet Take 1 tablet (325 mg total) by mouth daily with breakfast. (Patient not taking: Reported on  08/15/2021) 30 tablet 3   fluticasone (FLONASE) 50 MCG/ACT nasal spray fluticasone propionate 50 mcg/actuation nasal spray,suspension  USE 2 SPRAYS IN EACH NOSTRIL EVERY DAY AT NIGHT (Patient not taking: Reported on 08/15/2021)     levocetirizine (XYZAL) 5 MG tablet levocetirizine 5 mg tablet (Patient not taking: Reported on 08/15/2021)     linaclotide (LINZESS) 145 MCG CAPS capsule Take 145 mcg by mouth daily before breakfast. As needed (Patient not taking: Reported on 08/15/2021)     NOREL AD 4-10-325 MG TABS Take 1 tablet by mouth 2 times per day (Patient not taking: Reported on 08/15/2021) 20 tablet 0   traMADol (ULTRAM) 50 MG tablet Take 1 tablet (50 mg total) by mouth every 6 (six) hours as needed. (Patient not taking: Reported on 08/15/2021) 20 tablet 0   tretinoin (RETIN-A) 0.01 % gel Apply topically at bedtime as needed. (Patient not taking: Reported on 08/15/2021) 45 g 0   No current facility-administered medications for this visit.    Known medication allergies: Allergies  Allergen Reactions   Monistat [Miconazole]      Physical examination: Blood pressure 110/70, pulse 68, temperature 98.3 F (36.8 C), resp. rate 16, height 5\' 2"  (1.575 m), weight 140 lb (63.5 kg), SpO2 99 %.  General: Alert, interactive, in no acute distress. HEENT: Ecchymosis of the R lower eyelid PERRLA, TMs pearly gray, turbinates non-edematous without discharge, post-pharynx non erythematous. Neck: Supple without lymphadenopathy. Lungs: Clear to auscultation without wheezing, rhonchi or rales. {no increased work of breathing. CV: Normal S1, S2 without murmurs. Abdomen: Nondistended, nontender. Skin: Warm and dry, without lesions or rashes. Extremities:  No clubbing, cyanosis or edema. Neuro:   Grossly intact.  Diagnositics/Labs:  Allergy testing:   Airborne Adult Perc - 08/15/21 1400     Time Antigen Placed 1451    Allergen Manufacturer Waynette ButteryGreer    Location Back    Number of Test 59    1. Control-Buffer 50%  Glycerol Negative    2. Control-Histamine 1 mg/ml 2+    3. Albumin saline Negative    4. Bahia Negative    5. French Southern Territories[place]ermuda Negative    6. Johnson Negative    7. Kentucky Blue Negative    8. Meadow Fescue Negative    9. Perennial Rye Negative    10. Sweet Vernal Negative    11. Timothy Negative    12. Cocklebur Negative    13. Burweed Marshelder Negative    14. Ragweed, short Negative    15. Ragweed, Giant Negative    16. Plantain,  English Negative    17. Lamb's Quarters Negative    18. Sheep Sorrell Negative    19. Rough Pigweed Negative    20. Marsh Elder, Rough Negative    21. Mugwort, Common Negative  22. Ash mix Negative    23. Birch mix Negative    24. Beech American Negative    25. Box, Elder Negative    26. Cedar, red Negative    27. Cottonwood, Guinea-Bissau Negative    28. Elm mix Negative    29. Hickory Negative    30. Maple mix Negative    31. Oak, Guinea-[place]au mix Negative    32. Pecan Pollen Negative    33. Pine mix Negative    34. Sycamore Eastern Negative    35. Walnut, Black Pollen Negative    36. Alternaria alternata Negative    37. Cladosporium Herbarum Negative    38. Aspergillus mix Negative    39. Penicillium mix Negative    40. Bipolaris sorokiniana (Helminthosporium) Negative    41. Drechslera spicifera (Curvularia) Negative    42. Mucor plumbeus Negative    43. Fusarium moniliforme Negative    44. Aureobasidium pullulans (pullulara) Negative    45. Rhizopus oryzae Negative    46. Botrytis cinera Negative    47. Epicoccum nigrum Negative    48. Phoma betae Negative    49. Candida Albicans Negative    50. Trichophyton mentagrophytes Negative    51. Mite, D Farinae  5,000 AU/ml 2+    52. Mite, D Pteronyssinus  5,000 AU/ml Negative    53. Cat Hair 10,000 BAU/ml Negative    54.  Dog Epithelia Negative    55. Mixed Feathers Negative    56. Horse Epithelia Negative    57. Cockroach, German Negative    58. Mouse Negative    59. Tobacco Leaf Negative              Food Perc - 08/15/21 1500       Food   1. Peanut Negative    2. Soybean food Negative    3. Wheat, whole Negative    4. Sesame Negative    5. Milk, cow Negative    6. Egg White, chicken Negative    7. Casein Negative    8. Shellfish mix Negative    9. Fish mix Negative    10. Cashew Negative             Allergy testing results were read and interpreted by provider, documented by clinical staff.   Assessment and plan: Dermatitis-granuloma annulare Food intolerance Allergic rhinitis   -environmental allergy testing is positive to dust mites.  -allergen avoidance measures provided -common food allergy testing is negative -do not believe rash or gastrointestinal symptoms with ingestion of peanut or egg products represent an IgE mediated food allergy but could represent a food intolerance.  -for allergy symptom control (ie. Nasal congestion, drainage, sneezing, itchy/watery eyes etc) can take long-acting antihistamine Zyrtec, Allegra or Xyzal daily as needed.  May use nasal steroid sprays like Flonase for congestion or Astepro for drainage.  For itchy/watery eyes can use Pataday eye drops.  All of these are over-the-counter medications.   -continue current topical medication regimen as directed by dermatology  Follow-up as needed  I appreciate the opportunity to take part in Anna Powell's care. Please do not hesitate to contact me with questions.  Sincerely,   Margo Aye, MD Allergy/Immunology [facility]

## 2021-08-16 DIAGNOSIS — S022XXD Fracture of nasal bones, subsequent encounter for fracture with routine healing: Secondary | ICD-10-CM | POA: Diagnosis not present

## 2021-08-20 DIAGNOSIS — L920 Granuloma annulare: Secondary | ICD-10-CM | POA: Diagnosis not present

## 2021-09-13 ENCOUNTER — Other Ambulatory Visit: Payer: Self-pay | Admitting: Internal Medicine

## 2021-09-13 DIAGNOSIS — Z1231 Encounter for screening mammogram for malignant neoplasm of breast: Secondary | ICD-10-CM

## 2021-09-18 ENCOUNTER — Ambulatory Visit
Admission: RE | Admit: 2021-09-18 | Discharge: 2021-09-18 | Disposition: A | Discharge status: Home / Self Care | Payer: Federal, State, Local not specified - PPO | Source: Ambulatory Visit

## 2021-09-18 DIAGNOSIS — Z1231 Encounter for screening mammogram for malignant neoplasm of breast: Secondary | ICD-10-CM

## 2021-09-18 LAB — HM MAMMOGRAPHY: HM Mammogram: NORMAL (ref 0–4)

## 2021-10-11 ENCOUNTER — Inpatient Hospital Stay: Payer: Federal, State, Local not specified - PPO | Attending: Hematology and Oncology

## 2021-10-11 ENCOUNTER — Inpatient Hospital Stay: Payer: Federal, State, Local not specified - PPO

## 2021-10-11 ENCOUNTER — Inpatient Hospital Stay (HOSPITAL_BASED_OUTPATIENT_CLINIC_OR_DEPARTMENT_OTHER): Payer: Federal, State, Local not specified - PPO | Admitting: Hematology and Oncology

## 2021-10-11 ENCOUNTER — Other Ambulatory Visit: Payer: Self-pay

## 2021-10-11 ENCOUNTER — Encounter: Payer: Self-pay | Admitting: Hematology and Oncology

## 2021-10-11 VITALS — BP 110/70 | HR 64 | Temp 98.1°F | Resp 16 | Ht 62.0 in | Wt 138.0 lb

## 2021-10-11 DIAGNOSIS — R7989 Other specified abnormal findings of blood chemistry: Secondary | ICD-10-CM | POA: Diagnosis not present

## 2021-10-11 DIAGNOSIS — D649 Anemia, unspecified: Secondary | ICD-10-CM | POA: Insufficient documentation

## 2021-10-11 DIAGNOSIS — D508 Other iron deficiency anemias: Secondary | ICD-10-CM

## 2021-10-11 DIAGNOSIS — D696 Thrombocytopenia, unspecified: Secondary | ICD-10-CM | POA: Diagnosis not present

## 2021-10-11 LAB — COMPREHENSIVE METABOLIC PANEL
ALT: 17 U/L (ref 0–44)
AST: 19 U/L (ref 15–41)
Albumin: 4.2 g/dL (ref 3.5–5.0)
Alkaline Phosphatase: 56 U/L (ref 38–126)
Anion gap: 4 — ABNORMAL LOW (ref 5–15)
BUN: 17 mg/dL (ref 6–20)
CO2: 30 mmol/L (ref 22–32)
Calcium: 9.6 mg/dL (ref 8.9–10.3)
Chloride: 105 mmol/L (ref 98–111)
Creatinine, Ser: 1.12 mg/dL — ABNORMAL HIGH (ref 0.44–1.00)
GFR, Estimated: 59 mL/min — ABNORMAL LOW (ref >60)
Glucose, Bld: 91 mg/dL (ref 70–99)
Potassium: 4.2 mmol/L (ref 3.5–5.1)
Sodium: 139 mmol/L (ref 135–145)
Total Bilirubin: 0.4 mg/dL (ref 0.3–1.2)
Total Protein: 7.3 g/dL (ref 6.5–8.1)

## 2021-10-11 LAB — CBC WITH DIFFERENTIAL/PLATELET
Abs Immature Granulocytes: 0 10*3/uL (ref 0.00–0.07)
Basophils Absolute: 0 10*3/uL (ref 0.0–0.1)
Basophils Relative: 0 %
Eosinophils Absolute: 0 10*3/uL (ref 0.0–0.5)
Eosinophils Relative: 1 %
HCT: 37.8 % (ref 36.0–46.0)
Hemoglobin: 12.2 g/dL (ref 12.0–15.0)
Immature Granulocytes: 0 %
Lymphocytes Relative: 44 %
Lymphs Abs: 2.2 10*3/uL (ref 0.7–4.0)
MCH: 28.2 pg (ref 26.0–34.0)
MCHC: 32.3 g/dL (ref 30.0–36.0)
MCV: 87.3 fL (ref 80.0–100.0)
Monocytes Absolute: 0.4 10*3/uL (ref 0.1–1.0)
Monocytes Relative: 8 %
Neutro Abs: 2.3 10*3/uL (ref 1.7–7.7)
Neutrophils Relative %: 47 %
Platelets: 154 10*3/uL (ref 150–400)
RBC: 4.33 MIL/uL (ref 3.87–5.11)
RDW: 12.9 % (ref 11.5–15.5)
WBC: 5 10*3/uL (ref 4.0–10.5)
nRBC: 0 % (ref 0.0–0.2)

## 2021-10-11 LAB — IRON AND IRON BINDING CAPACITY (CC-WL,HP ONLY)
Iron: 106 ug/dL (ref 28–170)
Saturation Ratios: 37 % — ABNORMAL HIGH (ref 10.4–31.8)
TIBC: 290 ug/dL (ref 250–450)
UIBC: 184 ug/dL (ref 148–442)

## 2021-10-11 LAB — FERRITIN: Ferritin: 33 ng/mL (ref 11–307)

## 2021-10-11 NOTE — Progress Notes (Signed)
[name]  PROGRESS NOTE  Patient Care Team: Dorothyann Peng, MD as PCP - General (Internal Medicine)  CHIEF COMPLAINTS/PURPOSE OF CONSULTATION:  -Thrombocytopenia -Normocytic anemia  INTERIM HISTORY:   Anna Powell 51 y.o. female returns for a follow up for thrombocytopenia and normocytic anemia. She is unaccompanied for this visit. Since last visit, she denies any new complaints.  She is active, exercising, eating healthy.  She continues to take oral iron once a day.  Rest of the pertinent 10 point ROS reviewed and negative  REVIEW OF SYSTEMS:    Constitutional: Denies fevers, chills or abnormal night sweats Eyes: Denies blurriness of vision, double vision or watery eyes Ears, nose, mouth, throat, and face: Denies mucositis or sore throat Respiratory: Denies cough, dyspnea or wheezes Cardiovascular: Denies palpitation, chest discomfort or lower extremity swelling Gastrointestinal:  Denies nausea, heartburn or change in bowel habits Skin: Denies abnormal skin rashes Lymphatics: Denies new lymphadenopathy or easy bruising Neurological:Denies numbness, tingling or new weaknesses Behavioral/Psych: Mood is stable, no new changes    MEDICAL HISTORY:  Past Medical History:  Diagnosis Date   Bacterial vaginosis    Breast density 2008   right breast    Constipation    Gestational thrombocytopenia (HCC)    ITP (idiopathic thrombocytopenic purpura)     SURGICAL HISTORY: Past Surgical History:  Procedure Laterality Date   CESAREAN SECTION  02/18/2001   Dr. Normand Sloop   ENDOMETRIAL BIOPSY  11/01/2009   TUBAL LIGATION  02/18/2001   [facility]    SOCIAL HISTORY: Social History   Socioeconomic History   Marital status: Married    Spouse name: Not on file   Number of children: Not on file   Years of education: Not on file   Highest education level: Not on file  Occupational History   Not on file  Tobacco Use   Smoking status: Never   Smokeless  tobacco: Never  Vaping Use   Vaping Use: Never used  Substance and Sexual Activity   Alcohol use: No   Drug use: No   Sexual activity: Yes    Birth control/protection: Surgical    Comment: BTL  Other Topics Concern   Not on file  Social History Narrative   Not on file   Social Determinants of Health   Financial Resource Strain: Not on file  Food Insecurity: Not on file  Transportation Needs: Not on file  Physical Activity: Not on file  Stress: Not on file  Social Connections: Not on file  Intimate Partner Violence: Not on file    FAMILY HISTORY: Family History  Problem Relation Age of Onset   Hypertension Maternal Grandmother    Cancer Paternal Grandfather        lung   Heart disease Other        maternal great-grandmother heart attack    Hypertension Mother    Non-Hodgkin's lymphoma Father     ALLERGIES:  is allergic to monistat [miconazole].  MEDICATIONS:  Current Outpatient Medications  Medication Sig Dispense Refill   Celecoxib (CELEBREX PO) As needed (Patient not taking: Reported on 08/15/2021)     cetirizine-pseudoephedrine (ZYRTEC-D) 5-120 MG tablet Take 1 tablet by mouth 2 (two) times daily. (Patient not taking: Reported on 08/15/2021)     cyclobenzaprine (FLEXERIL) 5 MG tablet Take 1 tablet (5 mg total) by mouth 3 (three) times daily as needed for muscle spasms. (Patient not taking: Reported on 08/15/2021) 30 tablet 0   ferrous sulfate 325 (65 FE) MG EC tablet  Take 1 tablet (325 mg total) by mouth daily with breakfast. (Patient not taking: Reported on 08/15/2021) 30 tablet 3   fluticasone (FLONASE) 50 MCG/ACT nasal spray fluticasone propionate 50 mcg/actuation nasal spray,suspension  USE 2 SPRAYS IN EACH NOSTRIL EVERY DAY AT NIGHT (Patient not taking: Reported on 08/15/2021)     levocetirizine (XYZAL) 5 MG tablet levocetirizine 5 mg tablet (Patient not taking: Reported on 08/15/2021)     linaclotide (LINZESS) 145 MCG CAPS capsule Take 145 mcg by mouth daily before  breakfast. As needed (Patient not taking: Reported on 08/15/2021)     NOREL AD 4-10-325 MG TABS Take 1 tablet by mouth 2 times per day (Patient not taking: Reported on 08/15/2021) 20 tablet 0   traMADol (ULTRAM) 50 MG tablet Take 1 tablet (50 mg total) by mouth every 6 (six) hours as needed. (Patient not taking: Reported on 08/15/2021) 20 tablet 0   tretinoin (RETIN-A) 0.01 % gel Apply topically at bedtime as needed. (Patient not taking: Reported on 08/15/2021) 45 g 0   No current facility-administered medications for this visit.    PHYSICAL EXAMINATION: ECOG PERFORMANCE STATUS: 0 - Asymptomatic  Vitals:   10/11/21 1140  BP: 110/70  Pulse: 64  Resp: 16  Temp: 98.1 F (36.7 C)  SpO2: 100%   Filed Weights   10/11/21 1140  Weight: 138 lb (62.6 kg)    Physical Exam Constitutional:      Appearance: Normal appearance.  Cardiovascular:     Rate and Rhythm: Normal rate and regular rhythm.  Pulmonary:     Effort: Pulmonary effort is normal.     Breath sounds: Normal breath sounds.  Abdominal:     General: Abdomen is flat.     Palpations: Abdomen is soft.  Musculoskeletal:     Cervical back: Normal range of motion and neck supple. No rigidity.  Lymphadenopathy:     Cervical: No cervical adenopathy.  Skin:    General: Skin is warm and dry.  Neurological:     General: No focal deficit present.     Mental Status: She is alert.      LABORATORY DATA:  I have reviewed the data as listed Lab Results  Component Value Date   WBC 5.0 10/11/2021   HGB 12.2 10/11/2021   HCT 37.8 10/11/2021   MCV 87.3 10/11/2021   PLT 154 10/11/2021     Chemistry      Component Value Date/Time   NA 140 05/27/2021 1510   K 4.4 05/27/2021 1510   CL 101 05/27/2021 1510   CO2 25 05/27/2021 1510   BUN 14 05/27/2021 1510   CREATININE 1.02 (H) 05/27/2021 1510      Component Value Date/Time   CALCIUM 9.8 05/27/2021 1510   ALKPHOS 57 04/12/2021 1454   AST 25 04/12/2021 1454   ALT 19 04/12/2021 1454    BILITOT 0.4 04/12/2021 1454   BILITOT 0.4 04/10/2021 1547      RADIOGRAPHIC STUDIES: I have personally reviewed the radiological images as listed and agreed with the findings in the report. MM 3D SCREEN BREAST BILATERAL  Result Date: 09/19/2021 CLINICAL DATA:  Screening. EXAM: DIGITAL SCREENING BILATERAL MAMMOGRAM WITH TOMOSYNTHESIS AND CAD TECHNIQUE: Bilateral screening digital craniocaudal and mediolateral oblique mammograms were obtained. Bilateral screening digital breast tomosynthesis was performed. The images were evaluated with computer-aided detection. COMPARISON:  Previous exam(s). ACR Breast Density Category d: The breast tissue is extremely dense, which lowers the sensitivity of mammography FINDINGS: There are no findings suspicious for malignancy. IMPRESSION:  No mammographic evidence of malignancy. A result letter of this screening mammogram will be mailed directly to the patient. RECOMMENDATION: Screening mammogram in one year. (Code:SM-B-01Y) BI-RADS CATEGORY  1: Negative. Electronically Signed   By: Annia Belt M.D.   On: 09/19/2021 13:48    ASSESSMENT & PLAN:  Anna Powell is a 51 y.o. female who returns for follow-up.   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  --Lab results today with a normal platelet count  #Mild normocytic anemia --Borderline iron deficiency anemia,  --Labs today shows anemia has resolved with normal hemoglobin --Currently on ferrous sulfate 325 mg once daily. Advised to continue with a source of vitamin C.  --Continue to incorporate iron rich foods into diet.  --Iron panel and ferritin from today are pending.  We will follow-up on this and discuss any additional recommendations.  #Elevated creatinine levels: --Today's creatinine level is 1.1. --She can continue to follow-up with her PCP regarding this.  All questions were answered. The patient knows to call the  clinic with any problems, questions or concerns. Since her hematological concerns appear to have resolved, I will discharge her back to her PCP and recommended that she return to clinic as needed.  She expressed understanding of the recommendations.  I have spent a total of 30 minutes minutes of face-to-face and non-face-to-face time, preparing to see the patient, performing a medically appropriate examination, counseling and educating the patient,documenting clinical information in the electronic health record,and care coordination.    Rachel Moulds, MD Hematology and Oncology [facility] P: [phone]

## 2021-10-22 DIAGNOSIS — L700 Acne vulgaris: Secondary | ICD-10-CM | POA: Diagnosis not present

## 2021-10-22 DIAGNOSIS — D224 Melanocytic nevi of scalp and neck: Secondary | ICD-10-CM | POA: Diagnosis not present

## 2021-10-22 DIAGNOSIS — L920 Granuloma annulare: Secondary | ICD-10-CM | POA: Diagnosis not present

## 2021-10-22 DIAGNOSIS — L818 Other specified disorders of pigmentation: Secondary | ICD-10-CM | POA: Diagnosis not present

## 2021-10-28 ENCOUNTER — Other Ambulatory Visit: Payer: Federal, State, Local not specified - PPO

## 2021-10-28 DIAGNOSIS — Z1152 Encounter for screening for COVID-19: Secondary | ICD-10-CM | POA: Diagnosis not present

## 2021-10-29 LAB — NOVEL CORONAVIRUS, NAA: SARS-CoV-2, NAA: NOT DETECTED

## 2021-11-07 DIAGNOSIS — Z6824 Body mass index (BMI) 24.0-24.9, adult: Secondary | ICD-10-CM | POA: Diagnosis not present

## 2021-11-07 DIAGNOSIS — Z01411 Encounter for gynecological examination (general) (routine) with abnormal findings: Secondary | ICD-10-CM | POA: Diagnosis not present

## 2021-11-07 DIAGNOSIS — Z124 Encounter for screening for malignant neoplasm of cervix: Secondary | ICD-10-CM | POA: Diagnosis not present

## 2021-11-07 DIAGNOSIS — Z01419 Encounter for gynecological examination (general) (routine) without abnormal findings: Secondary | ICD-10-CM | POA: Diagnosis not present

## 2021-11-07 LAB — HM PAP SMEAR

## 2021-11-11 ENCOUNTER — Other Ambulatory Visit: Payer: Self-pay | Admitting: Obstetrics and Gynecology

## 2021-11-11 ENCOUNTER — Encounter: Payer: Self-pay | Admitting: Internal Medicine

## 2021-11-11 DIAGNOSIS — N6310 Unspecified lump in the right breast, unspecified quadrant: Secondary | ICD-10-CM

## 2021-11-13 ENCOUNTER — Other Ambulatory Visit: Payer: Self-pay | Admitting: Physician Assistant

## 2021-11-27 ENCOUNTER — Ambulatory Visit
Admission: RE | Admit: 2021-11-27 | Discharge: 2021-11-27 | Disposition: A | Discharge status: Home / Self Care | Payer: Federal, State, Local not specified - PPO | Source: Ambulatory Visit | Attending: Obstetrics and Gynecology | Admitting: Obstetrics and Gynecology

## 2021-11-27 DIAGNOSIS — N6489 Other specified disorders of breast: Secondary | ICD-10-CM | POA: Diagnosis not present

## 2021-11-27 DIAGNOSIS — N6310 Unspecified lump in the right breast, unspecified quadrant: Secondary | ICD-10-CM

## 2021-11-27 DIAGNOSIS — R922 Inconclusive mammogram: Secondary | ICD-10-CM | POA: Diagnosis not present

## 2021-12-17 ENCOUNTER — Encounter: Payer: Self-pay | Admitting: Internal Medicine

## 2021-12-17 ENCOUNTER — Ambulatory Visit (INDEPENDENT_AMBULATORY_CARE_PROVIDER_SITE_OTHER): Payer: Federal, State, Local not specified - PPO | Admitting: Internal Medicine

## 2021-12-17 VITALS — BP 112/78 | HR 63 | Temp 97.9°F | Ht 62.0 in | Wt 137.0 lb

## 2021-12-17 DIAGNOSIS — D696 Thrombocytopenia, unspecified: Secondary | ICD-10-CM | POA: Diagnosis not present

## 2021-12-17 DIAGNOSIS — Z23 Encounter for immunization: Secondary | ICD-10-CM | POA: Diagnosis not present

## 2021-12-17 DIAGNOSIS — H6121 Impacted cerumen, right ear: Secondary | ICD-10-CM

## 2021-12-17 DIAGNOSIS — Z0000 Encounter for general adult medical examination without abnormal findings: Secondary | ICD-10-CM

## 2021-12-17 DIAGNOSIS — Z6825 Body mass index (BMI) 25.0-25.9, adult: Secondary | ICD-10-CM | POA: Diagnosis not present

## 2021-12-17 DIAGNOSIS — K5909 Other constipation: Secondary | ICD-10-CM | POA: Diagnosis not present

## 2021-12-17 MED ORDER — TRULANCE 3 MG PO TABS: ORAL_TABLET | ORAL | 2 refills | Status: DC

## 2021-12-17 MED ORDER — MELOXICAM 7.5 MG PO TABS: 7.5000 mg | ORAL_TABLET | Freq: Every day | ORAL | 2 refills | Status: AC

## 2021-12-17 NOTE — Patient Instructions (Signed)

## 2021-12-17 NOTE — Progress Notes (Signed)
Anna Powell,acting as a Education administrator for Anna Greenland, MD.,have documented all relevant documentation on the behalf of Anna Greenland, MD,as directed by  Anna Greenland, MD while in the presence of Anna Greenland, MD.   Subjective:     Patient ID: Anna Powell , female    DOB: May 28, 1970 , 51 y.o.   MRN: [id number]   Chief Complaint  Patient presents with   Annual Exam    HPI  Patient here for HM. She is followed by Dr.Dillard for her GYN care. She was last seen August 2023. She has no specific complaints/concerns at this time.     Past Medical History:  Diagnosis Date   Bacterial vaginosis    Breast density 2008   right breast    Constipation    Gestational thrombocytopenia (HCC)    ITP (idiopathic thrombocytopenic purpura)      Family History  Problem Relation Age of Onset   Hypertension Maternal Grandmother    Cancer Paternal Grandfather        lung   Heart disease Other        maternal great-grandmother heart attack    Hypertension Mother    Non-Hodgkin's lymphoma Father      Current Outpatient Medications:    cetirizine-pseudoephedrine (ZYRTEC-D) 5-120 MG tablet, Take 1 tablet by mouth 2 (two) times daily., Disp: , Rfl:    clobetasol cream (TEMOVATE) 0.05 %, , Disp: , Rfl:    ferrous sulfate 325 (65 FE) MG EC tablet, TAKE 1 TABLET(325 MG) BY MOUTH DAILY WITH BREAKFAST, Disp: 30 tablet, Rfl: 3   fluticasone (FLONASE) 50 MCG/ACT nasal spray, , Disp: , Rfl:    meloxicam (MOBIC) 7.5 MG tablet, Take 1 tablet (7.5 mg total) by mouth daily., Disp: 30 tablet, Rfl: 2   NOREL AD 4-10-325 MG TABS, Take 1 tablet by mouth 2 times per day, Disp: 20 tablet, Rfl: 0   Plecanatide (TRULANCE) 3 MG TABS, One tab po qd, Disp: 30 tablet, Rfl: 2   triamcinolone (KENALOG) 0.025 % cream, APPLY TOPICALLY TO FACE TWICE DAILY, Disp: , Rfl:    Adapalene-Benzoyl Peroxide 0.3-2.5 % GEL, APPLY EXTERNALLY TO THE AFFECTED AREA OF ENTIRE FACE EVERY NIGHT AT BEDTIME, Disp: , Rfl:     pentoxifylline (TRENTAL) 400 MG CR tablet, Take 1 tablet by mouth 3 (three) times daily., Disp: , Rfl:    Allergies  Allergen Reactions   Monistat [Miconazole]       The patient states she uses post menopausal status for birth control. Last LMP was Patient's last menstrual period was 05/28/2020.. Negative for Dysmenorrhea. Negative for: breast discharge, breast lump(s), breast pain and breast self exam. Associated symptoms include abnormal vaginal bleeding. Pertinent negatives include abnormal bleeding (hematology), anxiety, decreased libido, depression, difficulty falling sleep, dyspareunia, history of infertility, nocturia, sexual dysfunction, sleep disturbances, urinary incontinence, urinary urgency, vaginal discharge and vaginal itching. Diet regular.The patient states her exercise level is  moderate.  . The patient's tobacco use is:  Social History   Tobacco Use  Smoking Status Never  Smokeless Tobacco Never  . She has been exposed to passive smoke. The patient's alcohol use is:  Social History   Substance and Sexual Activity  Alcohol Use No   Review of Systems  Constitutional: Negative.   HENT: Negative.    Eyes: Negative.   Respiratory: Negative.    Cardiovascular: Negative.   Gastrointestinal:  Positive for constipation.  Endocrine: Negative.   Genitourinary: Negative.   Musculoskeletal: Negative.  Skin: Negative.   Allergic/Immunologic: Negative.   Neurological: Negative.   Hematological: Negative.   Psychiatric/Behavioral: Negative.       Today's Vitals   12/17/21 1412  BP: 112/78  Pulse: 63  Temp: 97.9 F (36.6 C)  Weight: 137 lb (62.1 kg)  Height: 5' 2"  (1.575 m)  PainSc: 0-No pain   Body mass index is 25.06 kg/m.  Wt Readings from Last 3 Encounters:  12/17/21 137 lb (62.1 kg)  10/11/21 138 lb (62.6 kg)  08/15/21 140 lb (63.5 kg)     Objective:  Physical Exam Vitals and nursing note reviewed.  Constitutional:      Appearance: Normal  appearance.  HENT:     Head: Normocephalic and atraumatic.     Right Ear: Ear canal and external ear normal. There is impacted cerumen.     Left Ear: Tympanic membrane, ear canal and external ear normal.     Nose:     Comments: Masked     Mouth/Throat:     Comments: Masked  Eyes:     Extraocular Movements: Extraocular movements intact.     Conjunctiva/sclera: Conjunctivae normal.     Pupils: Pupils are equal, round, and reactive to light.  Cardiovascular:     Rate and Rhythm: Normal rate and regular rhythm.     Pulses: Normal pulses.     Heart sounds: Normal heart sounds.  Pulmonary:     Effort: Pulmonary effort is normal.     Breath sounds: Normal breath sounds.  Abdominal:     General: Abdomen is flat. Bowel sounds are normal.     Palpations: Abdomen is soft.  Genitourinary:    Comments: deferred Musculoskeletal:        General: Normal range of motion.     Cervical back: Normal range of motion and neck supple.  Skin:    General: Skin is warm and dry.     Comments: Hypopigmentation on RLE, no vesicular lesions noted  Neurological:     General: No focal deficit present.     Mental Status: She is alert and oriented to person, place, and time.  Psychiatric:        Mood and Affect: Mood normal.        Behavior: Behavior normal.      Assessment And Plan:     1. Encounter for general adult medical examination w/o abnormal findings Comments: A full exam was performed. Importance of monthly self breast exams was discussed with the patient. PATIENT IS ADVISED TO GET 30-45 MINUTES REGULAR EXERCISE NO LESS THAN FOUR TO FIVE DAYS PER WEEK - BOTH WEIGHTBEARING EXERCISES AND AEROBIC ARE RECOMMENDED.  PATIENT IS ADVISED TO FOLLOW A HEALTHY DIET WITH AT LEAST SIX FRUITS/VEGGIES PER DAY, DECREASE INTAKE OF RED MEAT, AND TO INCREASE FISH INTAKE TO TWO DAYS PER WEEK.  MEATS/FISH SHOULD NOT BE FRIED, BAKED OR BROILED IS PREFERABLE.  IT IS ALSO IMPORTANT TO CUT BACK ON YOUR SUGAR INTAKE. PLEASE  AVOID ANYTHING WITH ADDED SUGAR, CORN SYRUP OR OTHER SWEETENERS. IF YOU MUST USE A SWEETENER, YOU CAN TRY STEVIA. IT IS ALSO IMPORTANT TO AVOID ARTIFICIALLY SWEETENERS AND DIET BEVERAGES. LASTLY, I SUGGEST WEARING SPF 50 SUNSCREEN ON EXPOSED PARTS AND ESPECIALLY WHEN IN THE DIRECT SUNLIGHT FOR AN EXTENDED PERIOD OF TIME.  PLEASE AVOID FAST FOOD RESTAURANTS AND INCREASE YOUR WATER INTAKE. - Lipid panel - [place] - CBC with Diff - CMP14+EGFR  2. Impacted cerumen of right ear Comments: After obtaining verbal consent, right ear was flushed  via irrigation. No TM abnormalities were noted.  - Ear Lavage  3. Thrombocytopenia [facility]) Comments: July 2023 CBC results reviewed, platelet count is normal.  - CBC with Differential/Platelet  4. Chronic constipation Comments: Chronic, she will c/w Trulance daily.   5. BMI 25.0-25.9,adult Comments: She is encouraged to continue with her regular exercise regimen, at least 150 minutes of exercise per week.   6. Immunization due Comments: She was given Shingrix IM x 1. She will rto in 3-6 months for her second injection.   Patient was given opportunity to ask questions. Patient verbalized understanding of the plan and was able to repeat key elements of the plan. All questions were answered to their satisfaction.   I, Anna Greenland, MD, have reviewed all documentation for this visit. The documentation on 12/17/21 for the exam, diagnosis, procedures, and orders are all accurate and complete.   THE PATIENT IS ENCOURAGED TO PRACTICE SOCIAL DISTANCING DUE TO THE COVID-19 PANDEMIC.

## 2021-12-18 LAB — CBC WITH DIFFERENTIAL/PLATELET
Basophils Absolute: 0 10*3/uL (ref 0.0–0.2)
Basos: 0 %
EOS (ABSOLUTE): 0 10*3/uL (ref 0.0–0.4)
Eos: 1 %
Hematocrit: 37.6 % (ref 34.0–46.6)
Hemoglobin: 11.9 g/dL (ref 11.1–15.9)
Immature Grans (Abs): 0 10*3/uL (ref 0.0–0.1)
Immature Granulocytes: 0 %
Lymphocytes Absolute: 2.3 10*3/uL (ref 0.7–3.1)
Lymphs: 42 %
MCH: 27.9 pg (ref 26.6–33.0)
MCHC: 31.6 g/dL (ref 31.5–35.7)
MCV: 88 fL (ref 79–97)
Monocytes Absolute: 0.4 10*3/uL (ref 0.1–0.9)
Monocytes: 7 %
Neutrophils Absolute: 2.8 10*3/uL (ref 1.4–7.0)
Neutrophils: 50 %
RBC: 4.27 x10E6/uL (ref 3.77–5.28)
RDW: 12.8 % (ref 11.7–15.4)
WBC: 5.5 10*3/uL (ref 3.4–10.8)

## 2021-12-18 LAB — LIPID PANEL
Chol/HDL Ratio: 2 ratio (ref 0.0–4.4)
Cholesterol, Total: 143 mg/dL (ref 100–199)
HDL: 71 mg/dL (ref >39)
LDL Chol Calc (NIH): 63 mg/dL (ref 0–99)
Triglycerides: 33 mg/dL (ref 0–149)
VLDL Cholesterol Cal: 9 mg/dL (ref 5–40)

## 2021-12-18 LAB — CMP14+EGFR
ALT: 18 IU/L (ref 0–32)
AST: 21 IU/L (ref 0–40)
Albumin/Globulin Ratio: 1.7 (ref 1.2–2.2)
Albumin: 4.2 g/dL (ref 3.8–4.9)
Alkaline Phosphatase: 62 IU/L (ref 44–121)
BUN/Creatinine Ratio: 15 (ref 9–23)
BUN: 15 mg/dL (ref 6–24)
Bilirubin Total: 0.3 mg/dL (ref 0.0–1.2)
CO2: 24 mmol/L (ref 20–29)
Calcium: 9.8 mg/dL (ref 8.7–10.2)
Chloride: 103 mmol/L (ref 96–106)
Creatinine, Ser: 1.02 mg/dL — ABNORMAL HIGH (ref 0.57–1.00)
Globulin, Total: 2.5 g/dL (ref 1.5–4.5)
Glucose: 78 mg/dL (ref 70–99)
Potassium: 4.4 mmol/L (ref 3.5–5.2)
Sodium: 140 mmol/L (ref 134–144)
Total Protein: 6.7 g/dL (ref 6.0–8.5)
eGFR: 66 mL/min/{1.73_m2} (ref >59)

## 2021-12-18 LAB — FOLLICLE STIMULATING HORMONE: FSH: 111 m[IU]/mL

## 2021-12-23 ENCOUNTER — Other Ambulatory Visit: Payer: Self-pay | Admitting: Internal Medicine

## 2021-12-23 ENCOUNTER — Encounter: Payer: Self-pay | Admitting: Internal Medicine

## 2021-12-23 DIAGNOSIS — N289 Disorder of kidney and ureter, unspecified: Secondary | ICD-10-CM

## 2021-12-24 ENCOUNTER — Other Ambulatory Visit: Payer: Self-pay | Admitting: Internal Medicine

## 2021-12-24 ENCOUNTER — Other Ambulatory Visit: Payer: Federal, State, Local not specified - PPO

## 2021-12-25 LAB — MICROALBUMIN / CREATININE URINE RATIO
Creatinine, Urine: 23.2 mg/dL
Microalb/Creat Ratio: <13 mg/g creat (ref 0–29)
Microalbumin, Urine: <3 ug/mL

## 2021-12-26 DIAGNOSIS — L920 Granuloma annulare: Secondary | ICD-10-CM | POA: Diagnosis not present

## 2021-12-26 DIAGNOSIS — L700 Acne vulgaris: Secondary | ICD-10-CM | POA: Diagnosis not present

## 2021-12-27 ENCOUNTER — Ambulatory Visit (INDEPENDENT_AMBULATORY_CARE_PROVIDER_SITE_OTHER): Payer: Federal, State, Local not specified - PPO

## 2021-12-27 VITALS — BP 118/64 | HR 67 | Temp 97.8°F

## 2021-12-27 DIAGNOSIS — Z23 Encounter for immunization: Secondary | ICD-10-CM

## 2021-12-27 NOTE — Patient Instructions (Signed)
Influenza Virus Vaccine injection What is this medication? INFLUENZA VIRUS VACCINE (in floo EN zuh VAHY ruhs vak SEEN) helps to reduce the risk of getting influenza also known as the flu. The vaccine only helps protect you against some strains of the flu. This medicine may be used for other purposes; ask your health care provider or pharmacist if you have questions. COMMON BRAND NAME(S): Afluria, Afluria Quadrivalent, Agriflu, Alfuria, FLUAD, FLUAD Quadrivalent, Fluarix, Fluarix Quadrivalent, Flublok, Flublok Quadrivalent, FLUCELVAX, FLUCELVAX Quadrivalent, Flulaval, Flulaval Quadrivalent, Fluvirin, Fluzone, Fluzone High-Dose, Fluzone Intradermal, Fluzone Quadrivalent What should I tell my care team before I take this medication? They need to know if you have any of these conditions: bleeding disorder like hemophilia fever or infection Guillain-Barre syndrome or other neurological problems immune system problems infection with the human immunodeficiency virus (HIV) or AIDS low blood platelet counts multiple sclerosis an unusual or allergic reaction to influenza virus vaccine, latex, other medicines, foods, dyes, or preservatives. Different brands of vaccines contain different allergens. Some may contain latex or eggs. Talk to your doctor about your allergies to make sure that you get the right vaccine. pregnant or trying to get pregnant breast-feeding How should I use this medication? This vaccine is for injection into a muscle or under the skin. It is given by a health care professional. A copy of Vaccine Information Statements will be given before each vaccination. Read this sheet carefully each time. The sheet may change frequently. Talk to your healthcare provider to see which vaccines are right for you. Some vaccines should not be used in all age groups. Overdosage: If you think you have taken too much of this medicine contact a poison control center or emergency room at once. NOTE: This  medicine is only for you. Do not share this medicine with others. What if I miss a dose? This does not apply. What may interact with this medication? chemotherapy or radiation therapy medicines that lower your immune system like etanercept, anakinra, infliximab, and adalimumab medicines that treat or prevent blood clots like warfarin phenytoin steroid medicines like prednisone or cortisone theophylline vaccines This list may not describe all possible interactions. Give your health care provider a list of all the medicines, herbs, non-prescription drugs, or dietary supplements you use. Also tell them if you smoke, drink alcohol, or use illegal drugs. Some items may interact with your medicine. What should I watch for while using this medication? Report any side effects that do not go away within 3 days to your doctor or health care professional. Call your health care provider if any unusual symptoms occur within 6 weeks of receiving this vaccine. You may still catch the flu, but the illness is not usually as bad. You cannot get the flu from the vaccine. The vaccine will not protect against colds or other illnesses that may cause fever. The vaccine is needed every year. What side effects may I notice from receiving this medication? Side effects that you should report to your doctor or health care professional as soon as possible: allergic reactions like skin rash, itching or hives, swelling of the face, lips, or tongue Side effects that usually do not require medical attention (report to your doctor or health care professional if they continue or are bothersome): fever headache muscle aches and pains pain, tenderness, redness, or swelling at the injection site tiredness This list may not describe all possible side effects. Call your doctor for medical advice about side effects. You may report side effects to FDA at 1-800-FDA-1088.   Where should I keep my medication? The vaccine will be given  by a health care professional in a clinic, pharmacy, doctor's office, or other health care setting. You will not be given vaccine doses to store at home. NOTE: This sheet is a summary. It may not cover all possible information. If you have questions about this medicine, talk to your doctor, pharmacist, or health care provider.  2023 Elsevier/Gold Standard (2020-10-05 00:00:00)  

## 2021-12-27 NOTE — Progress Notes (Signed)
Patient presents today for influenza vaccine.  

## 2021-12-31 ENCOUNTER — Other Ambulatory Visit: Payer: Federal, State, Local not specified - PPO

## 2021-12-31 DIAGNOSIS — R058 Other specified cough: Secondary | ICD-10-CM

## 2022-01-01 ENCOUNTER — Other Ambulatory Visit (HOSPITAL_COMMUNITY): Payer: Self-pay

## 2022-01-01 ENCOUNTER — Other Ambulatory Visit: Payer: Self-pay

## 2022-01-01 LAB — NOVEL CORONAVIRUS, NAA: SARS-CoV-2, NAA: NOT DETECTED

## 2022-01-01 MED ORDER — TRULANCE 3 MG PO TABS
3.0000 mg | ORAL_TABLET | Freq: Every day | ORAL | 2 refills | Status: DC
  Filled 2022-01-01 – 2022-01-02 (×2): qty 30, 30d supply, fill #0

## 2022-01-02 ENCOUNTER — Encounter: Payer: Self-pay | Admitting: Internal Medicine

## 2022-01-02 ENCOUNTER — Other Ambulatory Visit: Payer: Self-pay | Admitting: Internal Medicine

## 2022-01-02 ENCOUNTER — Other Ambulatory Visit (HOSPITAL_COMMUNITY): Payer: Self-pay

## 2022-01-02 MED ORDER — MONTELUKAST SODIUM 10 MG PO TABS: 10.0000 mg | ORAL_TABLET | Freq: Every day | ORAL | 2 refills | Status: DC

## 2022-01-02 MED ORDER — HYDROCODONE BIT-HOMATROP MBR 5-1.5 MG/5ML PO SOLN: 5.0000 mL | Freq: Four times a day (QID) | ORAL | 0 refills | Status: DC | PRN

## 2022-02-05 ENCOUNTER — Ambulatory Visit: Payer: Federal, State, Local not specified - PPO | Admitting: Internal Medicine

## 2022-02-05 ENCOUNTER — Encounter: Payer: Self-pay | Admitting: Internal Medicine

## 2022-02-05 VITALS — BP 102/60 | HR 74 | Ht 62.0 in | Wt 138.0 lb

## 2022-02-05 DIAGNOSIS — R350 Frequency of micturition: Secondary | ICD-10-CM

## 2022-02-05 DIAGNOSIS — Z6825 Body mass index (BMI) 25.0-25.9, adult: Secondary | ICD-10-CM

## 2022-02-05 DIAGNOSIS — N3001 Acute cystitis with hematuria: Secondary | ICD-10-CM | POA: Diagnosis not present

## 2022-02-05 LAB — POCT URINALYSIS DIPSTICK
Bilirubin, UA: NEGATIVE
Glucose, UA: NEGATIVE
Ketones, UA: NEGATIVE
Nitrite, UA: NEGATIVE
Protein, UA: NEGATIVE
Spec Grav, UA: 1.01 (ref 1.010–1.025)
Urobilinogen, UA: 0.2 E.U./dL
pH, UA: 7 (ref 5.0–8.0)

## 2022-02-05 MED ORDER — NITROFURANTOIN MONOHYD MACRO 100 MG PO CAPS: 100.0000 mg | ORAL_CAPSULE | Freq: Two times a day (BID) | ORAL | 0 refills | Status: AC

## 2022-02-05 NOTE — Progress Notes (Signed)
Jeri Cos Llittleton,acting as a Neurosurgeon for Gwynneth Aliment, MD.,have documented all relevant documentation on the behalf of Gwynneth Aliment, MD,as directed by  Gwynneth Aliment, MD while in the presence of Gwynneth Aliment, MD.    Subjective:     Patient ID: Anna Powell , female    DOB: 01/20/1971 , 51 y.o.   MRN: [id number]   Chief Complaint  Patient presents with   Dysuria    HPI  Patient presents today for urinary frequency. Her sx started within the last 24-48 hours. Also w/ pelvic pressure. Last sexually active about 3 days ago. She does try to urinate post-sexual intercourse. Dysuria has not yet developed.      Past Medical History:  Diagnosis Date   Bacterial vaginosis    Breast density 2008   right breast    Constipation    Gestational thrombocytopenia (HCC)    ITP (idiopathic thrombocytopenic purpura)      Family History  Problem Relation Age of Onset   Hypertension Maternal Grandmother    Cancer Paternal Grandfather        lung   Heart disease Other        maternal great-grandmother heart attack    Hypertension Mother    Non-Hodgkin's lymphoma Father      Current Outpatient Medications:    Adapalene-Benzoyl Peroxide 0.3-2.5 % GEL, APPLY EXTERNALLY TO THE AFFECTED AREA OF ENTIRE FACE EVERY NIGHT AT BEDTIME, Disp: , Rfl:    cetirizine-pseudoephedrine (ZYRTEC-D) 5-120 MG tablet, Take 1 tablet by mouth 2 (two) times daily., Disp: , Rfl:    clobetasol cream (TEMOVATE) 0.05 %, , Disp: , Rfl:    ferrous sulfate 325 (65 FE) MG EC tablet, TAKE 1 TABLET(325 MG) BY MOUTH DAILY WITH BREAKFAST, Disp: 30 tablet, Rfl: 3   fluticasone (FLONASE) 50 MCG/ACT nasal spray, , Disp: , Rfl:    HYDROcodone bit-homatropine (HYDROMET) 5-1.5 MG/5ML syrup, Take 5 mLs by mouth every 6 (six) hours as needed for cough., Disp: 120 mL, Rfl: 0   meloxicam (MOBIC) 7.5 MG tablet, Take 1 tablet (7.5 mg total) by mouth daily., Disp: 30 tablet, Rfl: 2   montelukast (SINGULAIR) 10 MG  tablet, Take 1 tablet (10 mg total) by mouth daily., Disp: 30 tablet, Rfl: 2   nitrofurantoin, macrocrystal-monohydrate, (MACROBID) 100 MG capsule, Take 1 capsule (100 mg total) by mouth 2 (two) times daily for 5 days., Disp: 10 capsule, Rfl: 0   NOREL AD 4-10-325 MG TABS, Take 1 tablet by mouth 2 times per day, Disp: 20 tablet, Rfl: 0   pentoxifylline (TRENTAL) 400 MG CR tablet, Take 1 tablet by mouth 3 (three) times daily., Disp: , Rfl:    Plecanatide (TRULANCE) 3 MG TABS, Take 1 tablet ( 3 mg) by mouth daily., Disp: 30 tablet, Rfl: 2   triamcinolone (KENALOG) 0.025 % cream, APPLY TOPICALLY TO FACE TWICE DAILY, Disp: , Rfl:    Allergies  Allergen Reactions   Monistat [Miconazole]      Review of Systems  Constitutional: Negative.   Respiratory: Negative.    Cardiovascular: Negative.   Gastrointestinal: Negative.   Genitourinary:  Positive for frequency.  Neurological: Negative.   Psychiatric/Behavioral: Negative.       Today's Vitals   02/05/22 1416  BP: 102/60  Pulse: 74  Weight: 138 lb (62.6 kg)  Height: 5\' 2"  (1.575 m)  PainSc: 0-No pain   Body mass index is 25.24 kg/m.  Wt Readings from Last 3 Encounters:  02/05/22 138 lb (  62.6 kg)  12/17/21 137 lb (62.1 kg)  10/11/21 138 lb (62.6 kg)     Objective:  Physical Exam Vitals and nursing note reviewed.  Constitutional:      Appearance: Normal appearance.  HENT:     Head: Normocephalic and atraumatic.     Nose:     Comments: Masked     Mouth/Throat:     Comments: Masked  Eyes:     Extraocular Movements: Extraocular movements intact.  Cardiovascular:     Rate and Rhythm: Normal rate and regular rhythm.     Heart sounds: Normal heart sounds.  Pulmonary:     Effort: Pulmonary effort is normal.     Breath sounds: Normal breath sounds.  Abdominal:     Comments: No suprapubic tenderness  Skin:    General: Skin is warm.  Neurological:     General: No focal deficit present.     Mental Status: She is alert.   Psychiatric:        Mood and Affect: Mood normal.        Behavior: Behavior normal.      Assessment And Plan:     1. Acute cystitis with hematuria Comments: U/a suggestive of UTI. I will send rx macrobid to take twice daily, she is encouraged to complete full course of abx. - POCT Urinalysis Dipstick (81002)  2. BMI 25.0-25.9,adult   Patient was given opportunity to ask questions. Patient verbalized understanding of the plan and was able to repeat key elements of the plan. All questions were answered to their satisfaction.   I, Gwynneth Aliment, MD, have reviewed all documentation for this visit. The documentation on 02/08/22 for the exam, diagnosis, procedures, and orders are all accurate and complete.   IF YOU HAVE BEEN REFERRED TO A SPECIALIST, IT MAY TAKE 1-2 WEEKS TO SCHEDULE/PROCESS THE REFERRAL. IF YOU HAVE NOT HEARD FROM US/SPECIALIST IN TWO WEEKS, PLEASE GIVE [place] A CALL AT [phone].   THE PATIENT IS ENCOURAGED TO PRACTICE SOCIAL DISTANCING DUE TO THE COVID-19 PANDEMIC.

## 2022-03-05 ENCOUNTER — Other Ambulatory Visit: Payer: Self-pay

## 2022-03-05 MED ORDER — FLUTICASONE PROPIONATE 50 MCG/ACT NA SUSP: NASAL | 2 refills | Status: DC

## 2022-03-05 MED ORDER — NOREL AD 4-10-325 MG PO TABS: ORAL_TABLET | ORAL | 0 refills | Status: DC

## 2022-03-23 ENCOUNTER — Other Ambulatory Visit: Payer: Self-pay | Admitting: Internal Medicine

## 2022-04-14 ENCOUNTER — Ambulatory Visit (INDEPENDENT_AMBULATORY_CARE_PROVIDER_SITE_OTHER): Payer: Federal, State, Local not specified - PPO | Admitting: Internal Medicine

## 2022-04-14 ENCOUNTER — Encounter: Payer: Self-pay | Admitting: Internal Medicine

## 2022-04-14 VITALS — BP 110/82 | HR 69 | Temp 98.1°F | Ht 62.0 in | Wt 138.0 lb

## 2022-04-14 DIAGNOSIS — R0981 Nasal congestion: Secondary | ICD-10-CM

## 2022-04-14 DIAGNOSIS — U071 COVID-19: Secondary | ICD-10-CM | POA: Diagnosis not present

## 2022-04-14 LAB — POC COVID19 BINAXNOW: SARS Coronavirus 2 Ag: POSITIVE — AB

## 2022-04-14 NOTE — Patient Instructions (Signed)
Fatigue ?If you have fatigue, you feel tired all the time and have a lack of energy or a lack of motivation. Fatigue may make it difficult to start or complete tasks because of exhaustion. ?Occasional or mild fatigue is often a normal response to activity or life. However, long-term (chronic) or extreme fatigue may be a symptom of a medical condition such as: ?Depression. ?Not having enough red blood cells or hemoglobin in the blood (anemia). ?A problem with a small gland located in the lower front part of the neck (thyroid disorder). ?Rheumatologic conditions. These are problems related to the body's defense system (immune system). ?Infections, especially certain viral infections. ?Fatigue can also lead to negative health outcomes over time. ?Follow these instructions at home: ?Medicines ?Take over-the-counter and prescription medicines only as told by your health care provider. ?Take a multivitamin if told by your health care provider. ?Do not use herbal or dietary supplements unless they are approved by your health care provider. ?Eating and drinking ? ?Avoid heavy meals in the evening. ?Eat a well-balanced diet, which includes lean proteins, whole grains, plenty of fruits and vegetables, and low-fat dairy products. ?Avoid eating or drinking too many products with caffeine in them. ?Avoid alcohol. ?Drink enough fluid to keep your urine pale yellow. ?Activity ? ?Exercise regularly, as told by your health care provider. ?Use or practice techniques to help you relax, such as yoga, tai chi, meditation, or massage therapy. ?Lifestyle ?Change situations that cause you stress. Try to keep your work and personal schedules in balance. ?Do not use recreational or illegal drugs. ?General instructions ?Monitor your fatigue for any changes. ?Go to bed and get up at the same time every day. ?Avoid fatigue by pacing yourself during the day and getting enough sleep at night. ?Maintain a healthy weight. ?Contact a health care  provider if: ?Your fatigue does not get better. ?You have a fever. ?You suddenly lose or gain weight. ?You have headaches. ?You have trouble falling asleep or sleeping through the night. ?You feel angry, guilty, anxious, or sad. ?You have swelling in your legs or another part of your body. ?Get help right away if: ?You feel confused, feel like you might faint, or faint. ?Your vision is blurry or you have a severe headache. ?You have severe pain in your abdomen, your back, or the area between your waist and hips (pelvis). ?You have chest pain, shortness of breath, or an irregular or fast heartbeat. ?You are unable to urinate, or you urinate less than normal. ?You have abnormal bleeding from the rectum, nose, lungs, nipples, or, if you are female, the vagina. ?You vomit blood. ?You have thoughts about hurting yourself or others. ?These symptoms may be an emergency. Get help right away. Call 911. ?Do not wait to see if the symptoms will go away. ?Do not drive yourself to the hospital. ?Get help right away if you feel like you may hurt yourself or others, or have thoughts about taking your own life. Go to your nearest emergency room or: ?Call 911. ?Call the National Suicide Prevention Lifeline at 1-800-273-8255 or 988. This is open 24 hours a day. ?Text the Crisis Text Line at 741741. ?Summary ?If you have fatigue, you feel tired all the time and have a lack of energy or a lack of motivation. ?Fatigue may make it difficult to start or complete tasks because of exhaustion. ?Long-term (chronic) or extreme fatigue may be a symptom of a medical condition. ?Exercise regularly, as told by your health care provider. ?  Change situations that cause you stress. Try to keep your work and personal schedules in balance. ?This information is not intended to replace advice given to you by your health care provider. Make sure you discuss any questions you have with your health care provider. ?Document Revised: 12/24/2020 Document  Reviewed: 12/24/2020 ?Elsevier Patient Education ? 2023 Elsevier [employer]. ? ?

## 2022-04-14 NOTE — Progress Notes (Signed)
I,Victoria T Hamilton,acting as a scribe for [name], MD.,have documented all relevant documentation on the behalf of [name], MD,as directed by  [name], MD while in the presence of [name], MD.    Subjective:     Patient ID: Anna Powell , female    DOB: [birth year] , 52 y.o.   MRN: [id number]   Chief Complaint  Patient presents with   URI    HPI  Pt presents today with cold symptoms. She reports waking up Sat morning with scratchy throat. She went to see her new grandchild, but wore a mask. By that night, her sx progressed to achiness and chills. She also reports having sinus congestion, fatigue & headache. She denies ill contacts. She has tried Dayquil, Nyquil and Robitussin w/ minimal relief of her sx.    Past Medical History:  Diagnosis Date   Bacterial vaginosis    Breast density 2008   right breast    Constipation    Gestational thrombocytopenia (HCC)    ITP (idiopathic thrombocytopenic purpura)      Family History  Problem Relation Age of Onset   Hypertension Maternal Grandmother    Cancer Paternal Grandfather        lung   Heart disease Other        maternal great-grandmother heart attack    Hypertension Mother    Non-Hodgkin's lymphoma Father      Current Outpatient Medications:    Adapalene-Benzoyl Peroxide 0.3-2.5 % GEL, APPLY EXTERNALLY TO THE AFFECTED AREA OF ENTIRE FACE EVERY NIGHT AT BEDTIME, Disp: , Rfl:    cetirizine-pseudoephedrine (ZYRTEC-D) 5-120 MG tablet, Take 1 tablet by mouth 2 (two) times daily., Disp: , Rfl:    clobetasol cream (TEMOVATE) 0.05 %, , Disp: , Rfl:    ferrous sulfate 325 (65 FE) MG EC tablet, TAKE 1 TABLET(325 MG) BY MOUTH DAILY WITH BREAKFAST, Disp: 30 tablet, Rfl: 3   fluticasone (FLONASE) 50 MCG/ACT nasal spray, Insert to puffs into nostrils daily., Disp: 15.8 mL, Rfl: 2   HYDROcodone bit-homatropine (HYDROMET) 5-1.5 MG/5ML syrup, Take 5 mLs by mouth every 6 (six) hours as needed for  cough., Disp: 120 mL, Rfl: 0   meloxicam (MOBIC) 7.5 MG tablet, Take 1 tablet (7.5 mg total) by mouth daily., Disp: 30 tablet, Rfl: 2   montelukast (SINGULAIR) 10 MG tablet, TAKE 1 TABLET(10 MG) BY MOUTH DAILY, Disp: 90 tablet, Rfl: 1   NOREL AD 4-10-325 MG TABS, Take 1 tablet by mouth 2 times per day, Disp: 20 tablet, Rfl: 0   pentoxifylline (TRENTAL) 400 MG CR tablet, Take 1 tablet by mouth 3 (three) times daily., Disp: , Rfl:    Plecanatide (TRULANCE) 3 MG TABS, Take 1 tablet ( 3 mg) by mouth daily., Disp: 30 tablet, Rfl: 2   triamcinolone (KENALOG) 0.025 % cream, APPLY TOPICALLY TO FACE TWICE DAILY, Disp: , Rfl:    Allergies  Allergen Reactions   Monistat [Miconazole]      Review of Systems  Constitutional:  Positive for chills and fatigue.  HENT:  Positive for congestion.   Respiratory:  Positive for cough.   Cardiovascular: Negative.   Neurological: Negative.   Psychiatric/Behavioral: Negative.       Today's Vitals   04/14/22 0935  BP: 110/82  Pulse: 69  Temp: 98.1 F (36.7 C)  SpO2: 93%  Weight: 138 lb (62.6 kg)  Height: 5\' 2"  (1.575 m)   Body mass index is 25.24 kg/m.   Objective:  Physical Exam Vitals and nursing note reviewed.  Constitutional:      Appearance: Normal appearance. She is ill-appearing.  HENT:     Head: Normocephalic and atraumatic.  Eyes:     Extraocular Movements: Extraocular movements intact.  Cardiovascular:     Rate and Rhythm: Normal rate and regular rhythm.     Heart sounds: Normal heart sounds.  Pulmonary:     Effort: Pulmonary effort is normal.     Breath sounds: Normal breath sounds.  Skin:    General: Skin is warm.  Neurological:     General: No focal deficit present.     Mental Status: She is alert.  Psychiatric:        Mood and Affect: Mood normal.        Behavior: Behavior normal.       Assessment And Plan:     1. Sinus congestion - POC COVID-19 BinaxNow  2. COVID-19 Comments: She is at day 3 of symptoms. Advised  to start Zyrtec nightly. She will c/w OTC cough syrup. May RTW on Friday, Feb 2nd.  I will also refer her for home monitoring/temperature monitoring program. She is encouraged to email me daily on mychart to let me know how she is doing. She is encouraged to go to ER should she develop worsening SOB. Pt advised that she will be out of work for at least one week. She verbalizes understanding of her treatment plan. All questions were answered to her satisfaction. She understands that she needs to continue to self quarantine.   Patient was given opportunity to ask questions. Patient verbalized understanding of the plan and was able to repeat key elements of the plan. All questions were answered to their satisfaction.   I, [name], MD, have reviewed all documentation for this visit. The documentation on 04/14/22 for the exam, diagnosis, procedures, and orders are all accurate and complete.   IF YOU HAVE BEEN REFERRED TO A SPECIALIST, IT MAY TAKE 1-2 WEEKS TO SCHEDULE/PROCESS THE REFERRAL. IF YOU HAVE NOT HEARD FROM US/SPECIALIST IN TWO WEEKS, PLEASE GIVE [place] A CALL AT [phone].   THE PATIENT IS ENCOURAGED TO PRACTICE SOCIAL DISTANCING DUE TO THE COVID-19 PANDEMIC.

## 2022-04-20 ENCOUNTER — Other Ambulatory Visit: Payer: Self-pay | Admitting: Internal Medicine

## 2022-04-28 ENCOUNTER — Other Ambulatory Visit: Payer: Self-pay | Admitting: Physician Assistant

## 2022-05-17 ENCOUNTER — Other Ambulatory Visit: Payer: Self-pay | Admitting: Internal Medicine

## 2022-06-17 ENCOUNTER — Ambulatory Visit (INDEPENDENT_AMBULATORY_CARE_PROVIDER_SITE_OTHER): Payer: Federal, State, Local not specified - PPO

## 2022-06-17 VITALS — BP 112/80 | HR 85 | Temp 98.5°F | Ht 62.0 in | Wt 138.0 lb

## 2022-06-17 DIAGNOSIS — Z23 Encounter for immunization: Secondary | ICD-10-CM

## 2022-06-17 NOTE — Progress Notes (Signed)
Patient presents today for 2nd Shringles vaccine. Patient states feeling good today.

## 2022-06-27 DIAGNOSIS — Z79899 Other long term (current) drug therapy: Secondary | ICD-10-CM | POA: Diagnosis not present

## 2022-06-27 DIAGNOSIS — L700 Acne vulgaris: Secondary | ICD-10-CM | POA: Diagnosis not present

## 2022-06-27 DIAGNOSIS — L920 Granuloma annulare: Secondary | ICD-10-CM | POA: Diagnosis not present

## 2022-06-30 ENCOUNTER — Ambulatory Visit (INDEPENDENT_AMBULATORY_CARE_PROVIDER_SITE_OTHER): Payer: Federal, State, Local not specified - PPO | Admitting: Internal Medicine

## 2022-06-30 VITALS — BP 110/82 | HR 68 | Ht 62.0 in | Wt 145.7 lb

## 2022-06-30 DIAGNOSIS — L920 Granuloma annulare: Secondary | ICD-10-CM

## 2022-06-30 DIAGNOSIS — Z79899 Other long term (current) drug therapy: Secondary | ICD-10-CM | POA: Diagnosis not present

## 2022-06-30 DIAGNOSIS — D696 Thrombocytopenia, unspecified: Secondary | ICD-10-CM

## 2022-06-30 NOTE — Progress Notes (Signed)
Subjective:     Patient ID: Anna Powell , female    DOB: Jul 28, 1970 , 52 y.o.   MRN: [id number]   No chief complaint on file.   HPI  She presents today for Derm referral. She was previously seen by Dr. Benancio Deeds; however, she has since left [facility]. She was being treated for granuloma annulare. Dr. Cheree Ditto now wants her to have labwork prior to starting a new medication. Pt prefers to have her labs drawn in this office.      Past Medical History:  Diagnosis Date   Bacterial vaginosis    Breast density 2008   right breast    Constipation    Gestational thrombocytopenia    ITP (idiopathic thrombocytopenic purpura)      Family History  Problem Relation Age of Onset   Hypertension Maternal Grandmother    Cancer Paternal Grandfather        lung   Heart disease Other        maternal great-grandmother heart attack    Hypertension Mother    Non-Hodgkin's lymphoma Father      Current Outpatient Medications:    Adapalene-Benzoyl Peroxide 0.3-2.5 % GEL, APPLY EXTERNALLY TO THE AFFECTED AREA OF ENTIRE FACE EVERY NIGHT AT BEDTIME, Disp: , Rfl:    cetirizine-pseudoephedrine (ZYRTEC-D) 5-120 MG tablet, Take 1 tablet by mouth 2 (two) times daily., Disp: , Rfl:    clobetasol cream (TEMOVATE) 0.05 %, , Disp: , Rfl:    ferrous sulfate 325 (65 FE) MG EC tablet, TAKE 1 TABLET(325 MG) BY MOUTH DAILY WITH BREAKFAST, Disp: 30 tablet, Rfl: 3   fluticasone (FLONASE) 50 MCG/ACT nasal spray, Insert to puffs into nostrils daily., Disp: 15.8 mL, Rfl: 2   HYDROcodone bit-homatropine (HYDROMET) 5-1.5 MG/5ML syrup, Take 5 mLs by mouth every 6 (six) hours as needed for cough., Disp: 120 mL, Rfl: 0   meloxicam (MOBIC) 7.5 MG tablet, Take 1 tablet (7.5 mg total) by mouth daily., Disp: 30 tablet, Rfl: 2   montelukast (SINGULAIR) 10 MG tablet, TAKE 1 TABLET(10 MG) BY MOUTH DAILY, Disp: 90 tablet, Rfl: 1   NOREL AD 4-10-325 MG TABS, Take 1 tablet by mouth 2 times per day, Disp: 20 tablet,  Rfl: 0   pentoxifylline (TRENTAL) 400 MG CR tablet, Take 1 tablet by mouth 3 (three) times daily., Disp: , Rfl:    triamcinolone (KENALOG) 0.025 % cream, APPLY TOPICALLY TO FACE TWICE DAILY, Disp: , Rfl:    TRULANCE 3 MG TABS, TAKE 1 TABLET BY MOUTH EVERY DAY, Disp: 30 tablet, Rfl: 2   Allergies  Allergen Reactions   Monistat [Miconazole]      Review of Systems  Constitutional: Negative.   Respiratory: Negative.    Cardiovascular: Negative.   Gastrointestinal: Negative.   Neurological: Negative.   Psychiatric/Behavioral: Negative.       Today's Vitals   06/30/22 1046  BP: 110/82  Pulse: 68  SpO2: 98%  Height: 5\' 2"  (1.575 m)   Body mass index is 25.24 kg/m.   Objective:  Physical Exam Vitals and nursing note reviewed.  Constitutional:      Appearance: Normal appearance.  HENT:     Head: Normocephalic and atraumatic.  Eyes:     Extraocular Movements: Extraocular movements intact.  Cardiovascular:     Rate and Rhythm: Normal rate and regular rhythm.     Heart sounds: Normal heart sounds.  Pulmonary:     Effort: Pulmonary effort is normal.     Breath sounds: Normal  breath sounds.  Musculoskeletal:     Cervical back: Normal range of motion.  Skin:    General: Skin is warm.     Comments: Arms examined, area of hypopigmentation on R forearm, scaly  Neurological:     General: No focal deficit present.     Mental Status: She is alert.  Psychiatric:        Mood and Affect: Mood normal.        Behavior: Behavior normal.       Assessment And Plan:     1. Granuloma annulare Comments: Chronic, I will refer her to Dr. Onalee Hua as requested. She will c/w pentoxifylline and clobetasol cream as previously instructed. - Acute Viral Hepatitis (HAV, HBV, HCV) - Ambulatory referral to Dermatology  2. Thrombocytopenia Comments: Chronic, I will check labs as below. Most recent Hematology notes reviewed as well.  3. Drug therapy - QuantiFERON-TB Gold Plus - CMP14+EGFR - CBC  with Diff - Vitamin D (25 hydroxy)   Patient was given opportunity to ask questions. Patient verbalized understanding of the plan and was able to repeat key elements of the plan. All questions were answered to their satisfaction.   I, Gwynneth Aliment, MD, have reviewed all documentation for this visit. The documentation on 06/30/22 for the exam, diagnosis, procedures, and orders are all accurate and complete.   IF YOU HAVE BEEN REFERRED TO A SPECIALIST, IT MAY TAKE 1-2 WEEKS TO SCHEDULE/PROCESS THE REFERRAL. IF YOU HAVE NOT HEARD FROM US/SPECIALIST IN TWO WEEKS, PLEASE GIVE [place] A CALL AT [phone].   THE PATIENT IS ENCOURAGED TO PRACTICE SOCIAL DISTANCING DUE TO THE COVID-19 PANDEMIC.

## 2022-07-01 LAB — ACUTE VIRAL HEPATITIS (HAV, HBV, HCV)
HCV Ab: NONREACTIVE
Hep A IgM: NEGATIVE
Hep B C IgM: NEGATIVE
Hepatitis B Surface Ag: NEGATIVE

## 2022-07-01 LAB — HCV INTERPRETATION

## 2022-07-03 LAB — CBC WITH DIFFERENTIAL/PLATELET
Basophils Absolute: 0 10*3/uL (ref 0.0–0.2)
Basos: 0 %
EOS (ABSOLUTE): 0 10*3/uL (ref 0.0–0.4)
Eos: 1 %
Hematocrit: 39.4 % (ref 34.0–46.6)
Hemoglobin: 12.4 g/dL (ref 11.1–15.9)
Immature Grans (Abs): 0 10*3/uL (ref 0.0–0.1)
Immature Granulocytes: 0 %
Lymphocytes Absolute: 1.7 10*3/uL (ref 0.7–3.1)
Lymphs: 31 %
MCH: 28.1 pg (ref 26.6–33.0)
MCHC: 31.5 g/dL (ref 31.5–35.7)
MCV: 89 fL (ref 79–97)
Monocytes Absolute: 0.5 10*3/uL (ref 0.1–0.9)
Monocytes: 8 %
Neutrophils Absolute: 3.2 10*3/uL (ref 1.4–7.0)
Neutrophils: 60 %
RBC: 4.42 x10E6/uL (ref 3.77–5.28)
RDW: 12.5 % (ref 11.7–15.4)
WBC: 5.4 10*3/uL (ref 3.4–10.8)

## 2022-07-03 LAB — CMP14+EGFR
ALT: 19 IU/L (ref 0–32)
AST: 21 IU/L (ref 0–40)
Albumin/Globulin Ratio: 1.5 (ref 1.2–2.2)
Albumin: 4.4 g/dL (ref 3.8–4.9)
Alkaline Phosphatase: 90 IU/L (ref 44–121)
BUN/Creatinine Ratio: 15 (ref 9–23)
BUN: 14 mg/dL (ref 6–24)
Bilirubin Total: 0.2 mg/dL (ref 0.0–1.2)
CO2: 24 mmol/L (ref 20–29)
Calcium: 9.7 mg/dL (ref 8.7–10.2)
Chloride: 101 mmol/L (ref 96–106)
Creatinine, Ser: 0.93 mg/dL (ref 0.57–1.00)
Globulin, Total: 2.9 g/dL (ref 1.5–4.5)
Glucose: 84 mg/dL (ref 70–99)
Potassium: 4.3 mmol/L (ref 3.5–5.2)
Sodium: 140 mmol/L (ref 134–144)
Total Protein: 7.3 g/dL (ref 6.0–8.5)
eGFR: 73 mL/min/{1.73_m2} (ref >59)

## 2022-07-03 LAB — QUANTIFERON-TB GOLD PLUS
QuantiFERON Mitogen Value: >10 IU/mL
QuantiFERON Nil Value: 0.02 IU/mL
QuantiFERON TB1 Ag Value: 0 IU/mL
QuantiFERON TB2 Ag Value: 0 IU/mL
QuantiFERON-TB Gold Plus: NEGATIVE

## 2022-07-03 LAB — VITAMIN D 25 HYDROXY (VIT D DEFICIENCY, FRACTURES): Vit D, 25-Hydroxy: 37.4 ng/mL (ref 30.0–100.0)

## 2022-07-08 ENCOUNTER — Encounter: Payer: Self-pay | Admitting: Internal Medicine

## 2022-08-12 ENCOUNTER — Other Ambulatory Visit: Payer: Self-pay | Admitting: Internal Medicine

## 2022-08-12 DIAGNOSIS — Z1231 Encounter for screening mammogram for malignant neoplasm of breast: Secondary | ICD-10-CM

## 2022-09-03 ENCOUNTER — Other Ambulatory Visit: Payer: Self-pay

## 2022-09-03 DIAGNOSIS — K5909 Other constipation: Secondary | ICD-10-CM

## 2022-09-03 MED ORDER — TRULANCE 3 MG PO TABS
1.0000 | ORAL_TABLET | Freq: Every day | ORAL | 2 refills | Status: AC
Start: 2022-09-03 — End: ?

## 2022-09-04 IMAGING — CT CT MAXILLOFACIAL W/O CM
3 series · 14 of 47 positions shown, 16 images · non-contrast
Comparison: None Available.

CLINICAL DATA: Right facial pain, injury



[Series 3: max soft · axial · 0.48mm/px · z∈[+1328,+1454]mm · 8 of 73 slices shown, 10 images]
[im 5/73  brain]
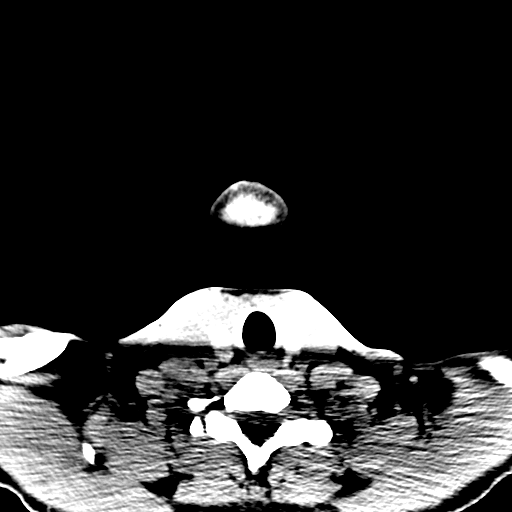
[im 5/73  bone]
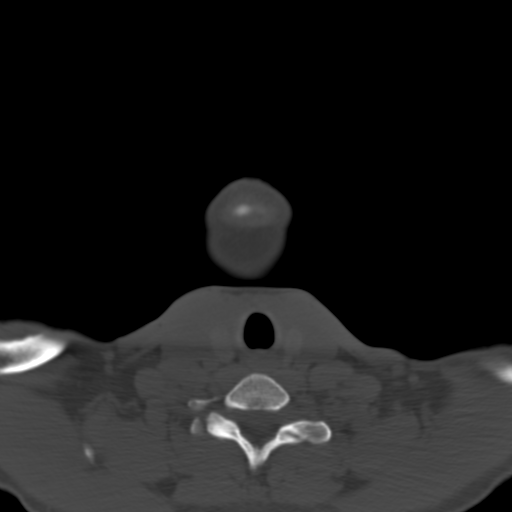
[im 15/73  bone]
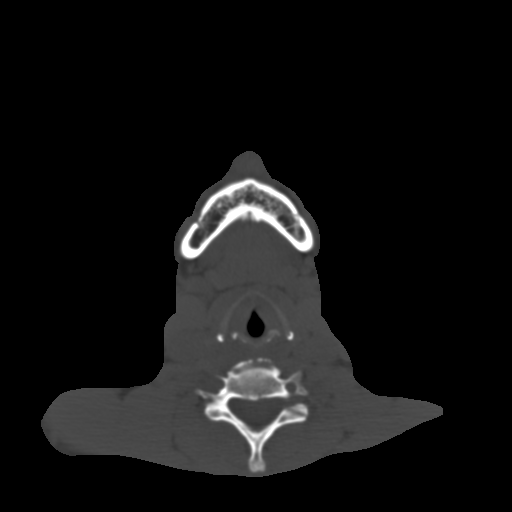
[im 23/73  bone]
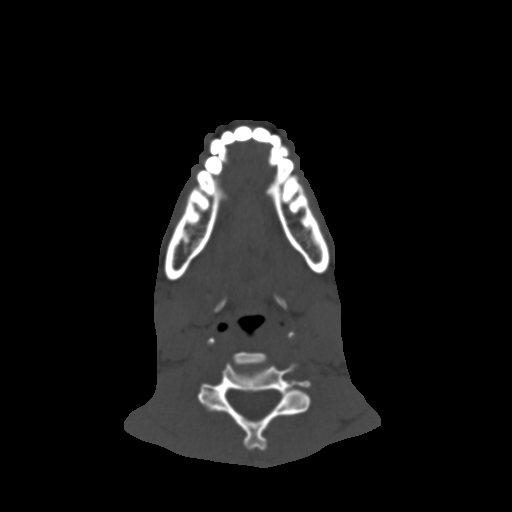
[im 33/73  bone]
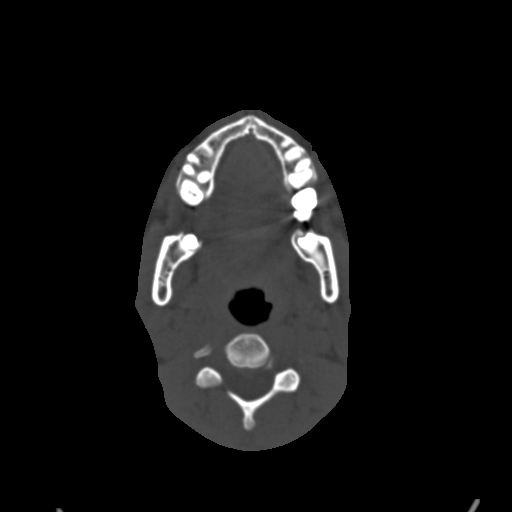
[im 40/73  brain]
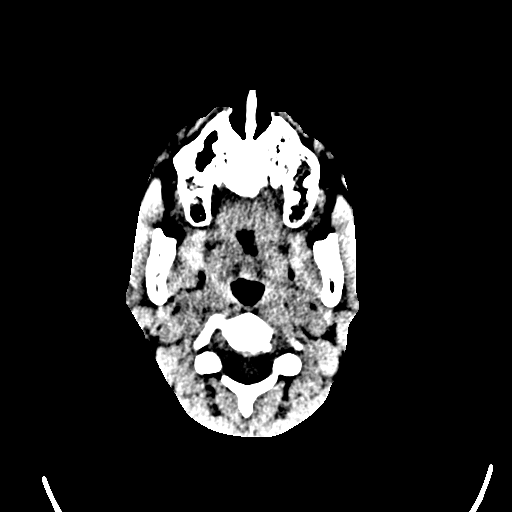
[im 40/73  bone]
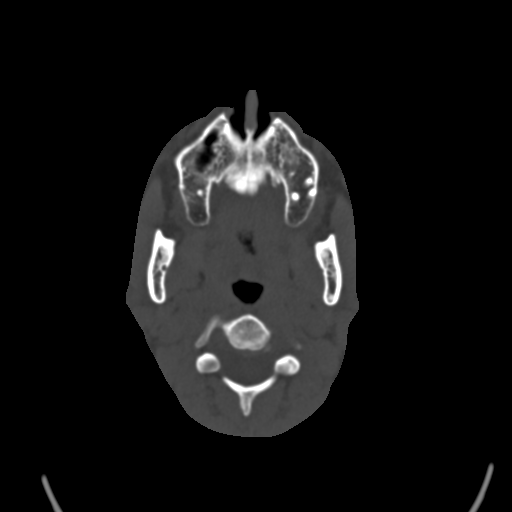
[im 50/73  bone]
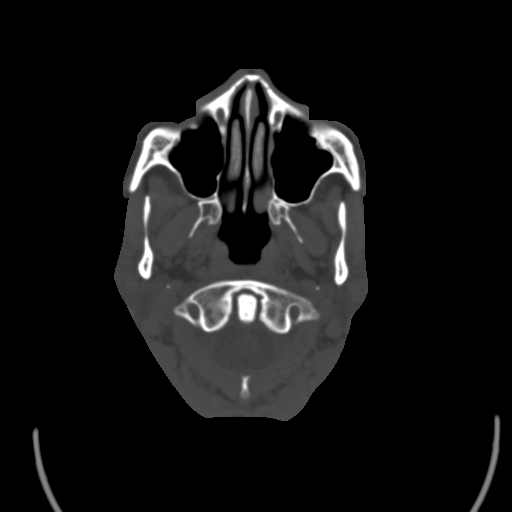
[im 58/73  bone]
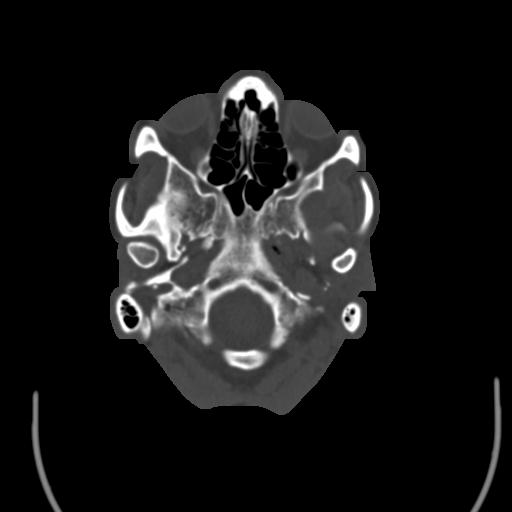
[im 68/73  bone]
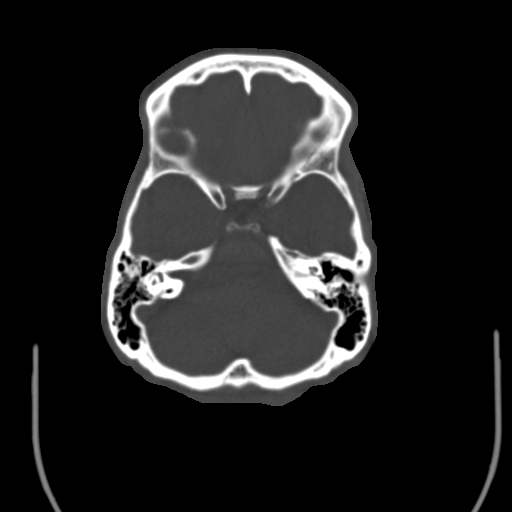

[Series 7: coronal soft · coronal · 0.33mm/px · 3 of 116 slices shown]
[im 39/116  bone]
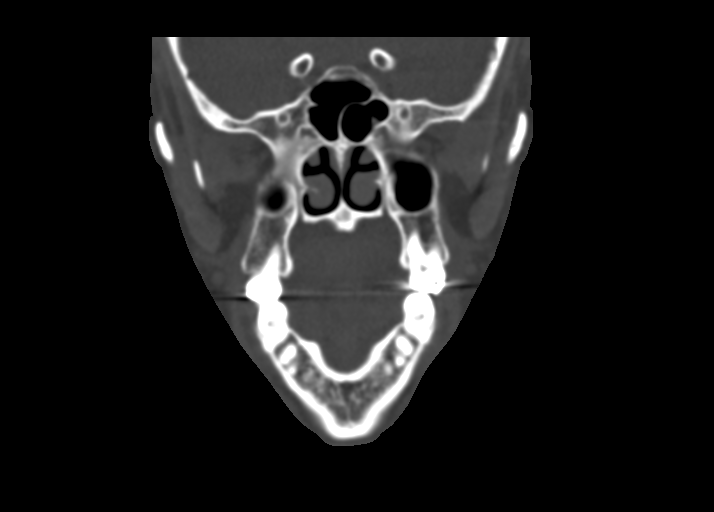
[im 52/116  bone]
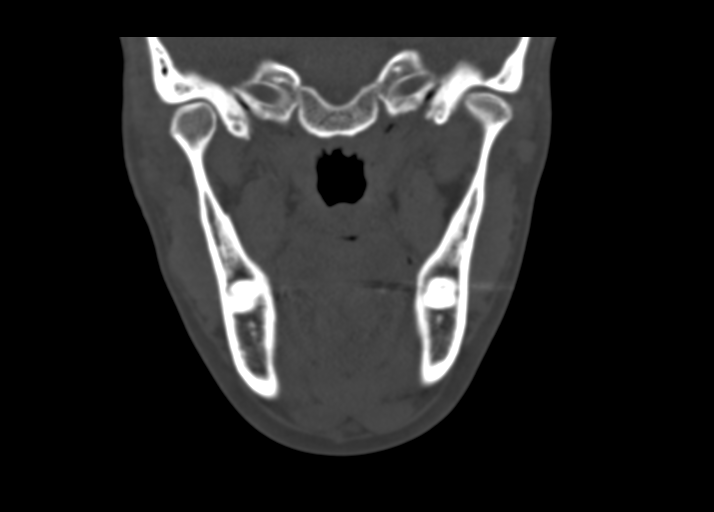
[im 64/116  bone]
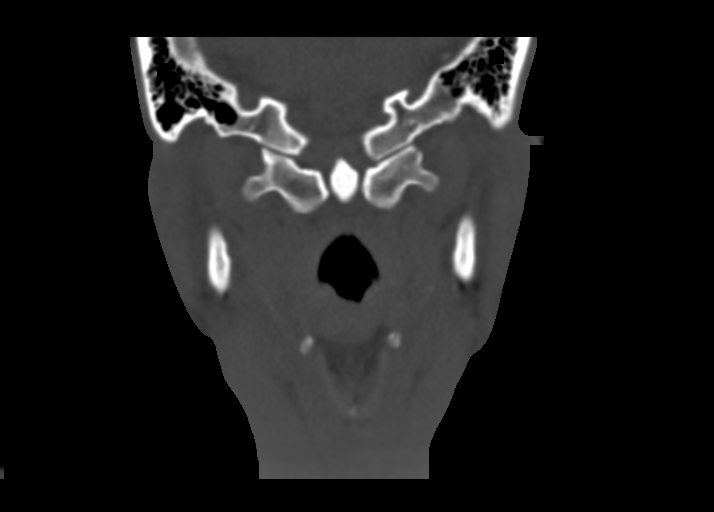

[Series 8: sagittal soft · sagittal · 0.33mm/px · 3 of 119 slices shown]
[im 40/119  bone]
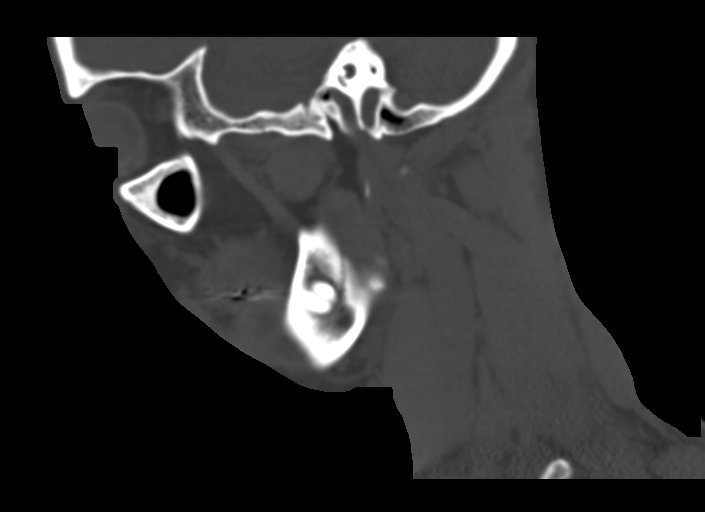
[im 60/119  bone]
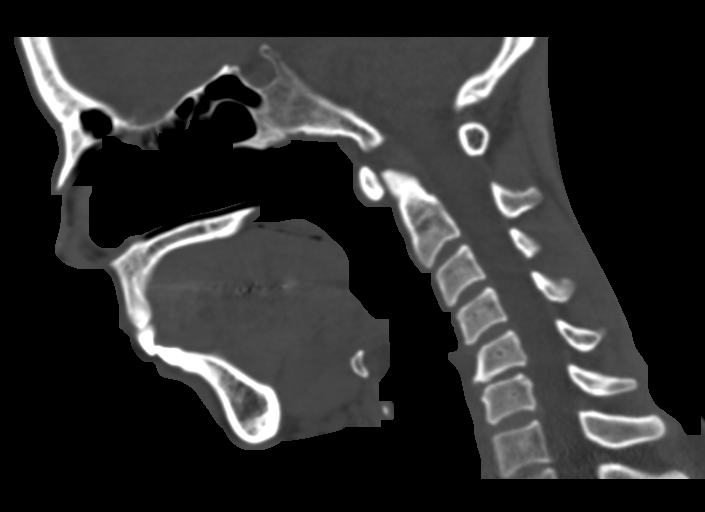
[im 79/119  bone]
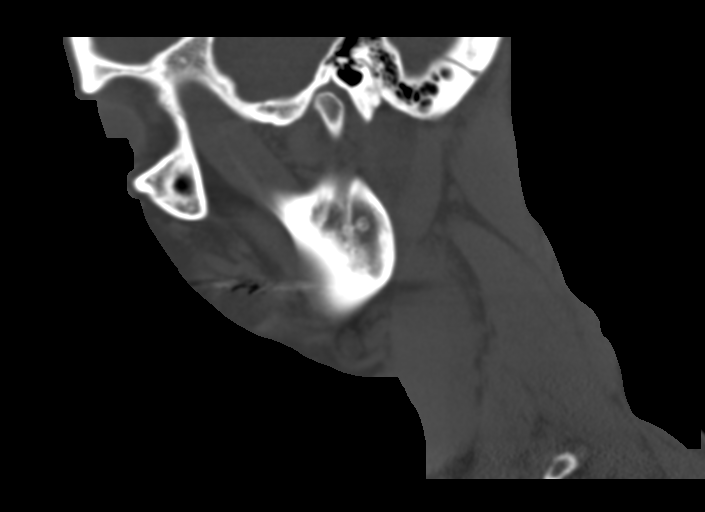

[14 of 47 positions shown; findings below may reference images not displayed]

FINDINGS: Osseous: Acute mildly depressed right nasal bone fracture. No
additional acute maxillofacial bone fracture. Bony orbital walls are
intact. Mandible intact. Temporomandibular joints are aligned
without dislocation.

Orbits: Negative. No traumatic or inflammatory finding.

Sinuses: Paranasal sinuses are clear.

Soft tissues: Right maxillary and nasal soft tissue swelling.

Limited intracranial: No significant or unexpected finding.
IMPRESSION: Acute mildly depressed right nasal bone fracture.

## 2022-09-26 ENCOUNTER — Ambulatory Visit
Admission: RE | Admit: 2022-09-26 | Discharge: 2022-09-26 | Disposition: A | Discharge status: Home / Self Care | Payer: Federal, State, Local not specified - PPO | Source: Ambulatory Visit

## 2022-09-26 DIAGNOSIS — Z1231 Encounter for screening mammogram for malignant neoplasm of breast: Secondary | ICD-10-CM

## 2022-10-06 ENCOUNTER — Ambulatory Visit (INDEPENDENT_AMBULATORY_CARE_PROVIDER_SITE_OTHER): Payer: Federal, State, Local not specified - PPO | Admitting: Dermatology

## 2022-10-06 VITALS — BP 105/65

## 2022-10-06 DIAGNOSIS — L700 Acne vulgaris: Secondary | ICD-10-CM

## 2022-10-06 DIAGNOSIS — L920 Granuloma annulare: Secondary | ICD-10-CM

## 2022-10-06 DIAGNOSIS — L709 Acne, unspecified: Secondary | ICD-10-CM

## 2022-10-06 MED ORDER — TACROLIMUS 0.1 % EX OINT: TOPICAL_OINTMENT | Freq: Two times a day (BID) | CUTANEOUS | 3 refills | Status: DC

## 2022-10-06 NOTE — Progress Notes (Signed)
   New Patient Visit   Subjective  Anna Powell is a 52 y.o. female who presents for the following: She reports a history of Granuloma Annulare on the extremities and torso x 2 years. She started treatment early 2023. It was biopsy proven. It gets itchy and inflamed at times. It can be a 5-6 on itch scale. She uses Adapapalene/BPO for the face underlying Acne. Face is currently clear. Her medication regimen includes Triamcinolone .025 cream for flares on the face. Clobetasol cream for itching 1-2 weekly on the body. She has a history  Anemia   Dr. Kermit Balo HPI: The patient reports that their granuloma annulare (GA) started around August or September a couple of years ago and was not treated until early 2023. They mention having a biopsy on their neck to diagnose the condition and having a spot there. The patient describes active spots on their bilateral shins, bilateral forearms with hypopigmented patches, and a cluster of flat-topped pink papules on a background of hypopigmentation on their inner thighs. They believe the hypopigmentation is a result of steroid use.  The patient has a history of anemia, irritable bowel syndrome (IBS), and allergies, for which they take iron, Trulance, Singulair, and vitamin D3, respectively. They also had acne, which was treated with Adapalene benzoyl peroxide. The patient reports that the GA lesions appeared gradually, starting with the neck, then the face, and finally the chest spot. They are currently using clobetasol, which helps with itching but not necessarily with the spots, as well as Triamcinolone for the body and Adapalene at night.  The patient inquires about treatment options for their GA and expresses concern about the appearance of their pores, which they believe may be related to scratching.    The following portions of the chart were reviewed this encounter and updated as appropriate: medications, allergies, medical history  Review of  Systems:  No other skin or systemic complaints except as noted in HPI or Assessment and Plan.  Objective  Well appearing patient in no apparent distress; mood and affect are within normal limits.   A focused examination was performed of the following areas: Face, Torso and extremities :Flat-topped pink papules on a hypopigmented background, located on the inner thighs bilaterally.  Relevant exam findings are noted in the Assessment and Plan.    Assessment & Plan    Granuloma Annulare Exam: Bilateral shins, thighs, and forearms with clusters of flat top pink papules on a background of hypopigmentation on the face, arms, legs  Treatment Plan:  Tacrolimus ointment 2 x daily  Plan: Consider ILK injections Halbetasol Tazarotene coumpound   Granuloma annulare is a chronic benign skin condition characterized by pink smooth bumps or ring-like plaques most commonly appearing over the joints and the backs of the hands/feet. Its cause is not known, and most episodes of granuloma annulare clear up after a few years, with or without treatment.   2. . Acne    - Assessment: Current acne management reviewed.    - Plan: Continue use of Adapalene-Benzoyl Peroxide for acne management.   No follow-ups on file.  Jaclynn Guarneri, CMA, am acting as scribe for [employer], DO.   Documentation: I have reviewed the above documentation for accuracy and completeness, and I agree with the above.  Langston Reusing, DO

## 2022-10-06 NOTE — Patient Instructions (Addendum)
Hi Anna Powell,  Thank you for visiting my office today. I appreciate your commitment to improving your health and addressing your dermatological concerns. Here is a summary of our discussion and the treatment plan for your Granuloma Annulare we have outlined:  - Medication Adjustments:   - Discontinue use of current steroids to address hypopigmentation issues.   - Start Tacrolimus ointment: Apply twice daily, morning and night to affected areas on face and body, for the next three months. This medication will help control inflammation and itching without the side effects of steroids.  - Monitoring and Follow-Up:   - Return in three months for a follow-up appointment to assess the effectiveness of the Tacrolimus treatment.   - If there is insufficient improvement, we will consider:     - Injecting dilute corticosteroids directly into the lesions.     - Using a Halobetasol-Tazarotene combination cream if available.  - General Skin Care:   - Sun exposure: About 20 minutes per day to help restore pigment contrast affected by steroid use.  - Additional Notes:   - Continue using Adapalene at night for acne management as previously effective.   - Keep using your other medications (iron for anemia, Trulance for IBS, Singulair for allergies, and Vitamin D3) as prescribed.  Please ensure to contact us through MyChart if you experience any worsening of symptoms or have any concerns before your next scheduled visit. We will also be taking photographs during your visits to monitor progress.  It was a pleasure to see you, and I look forward to our next appointment. Enjoy the rest of your summer!      Due to recent changes in healthcare laws, you may see results of your pathology and/or laboratory studies on MyChart before the doctors have had a chance to review them. We understand that in some cases there may be results that are confusing or concerning to you. Please understand that not all results are  received at the same time and often the doctors may need to interpret multiple results in order to provide you with the best plan of care or course of treatment. Therefore, we ask that you please give [place] 2 business days to thoroughly review all your results before contacting the office for clarification. Should we see a critical lab result, you will be contacted sooner.   If You Need Anything After Your Visit  If you have any questions or concerns for your doctor, please call our main line at [phone] If no one answers, please leave a voicemail as directed and we will return your call as soon as possible. Messages left after 4 pm will be answered the following business day.   You may also send [place] a message via MyChart. We typically respond to MyChart messages within 1-2 business days.  For prescription refills, please ask your pharmacy to contact our office. Our fax number is [phone].  If you have an urgent issue when the clinic is closed that cannot wait until the next business day, you can page your doctor at the number below.    Please note that while we do our best to be available for urgent issues outside of office hours, we are not available 24/7.   If you have an urgent issue and are unable to reach [place], you may choose to seek medical care at your doctor's office, retail clinic, urgent care center, or emergency room.  If you have a medical emergency, please immediately call 911 or go to the  emergency department. In the event of inclement weather, please call our main line at [phone] for an update on the status of any delays or closures.  Dermatology Medication Tips: Please keep the boxes that topical medications come in in order to help keep track of the instructions about where and how to use these. Pharmacies typically print the medication instructions only on the boxes and not directly on the medication tubes.   If your medication is too expensive, please contact our  office at [phone] or send [place] a message through MyChart.   We are unable to tell what your co-pay for medications will be in advance as this is different depending on your insurance coverage. However, we may be able to find a substitute medication at lower cost or fill out paperwork to get insurance to cover a needed medication.   If a prior authorization is required to get your medication covered by your insurance company, please allow [place] 1-2 business days to complete this process.  Drug prices often vary depending on where the prescription is filled and some pharmacies may offer cheaper prices.  The website www.goodrx.com contains coupons for medications through different pharmacies. The prices here do not account for what the cost may be with help from insurance (it may be cheaper with your insurance), but the website can give you the price if you did not use any insurance.  - You can print the associated coupon and take it with your prescription to the pharmacy.  - You may also stop by our office during regular business hours and pick up a GoodRx coupon card.  - If you need your prescription sent electronically to a different pharmacy, notify our office through [facility] or by phone at [phone]

## 2022-11-11 DIAGNOSIS — Z01411 Encounter for gynecological examination (general) (routine) with abnormal findings: Secondary | ICD-10-CM | POA: Diagnosis not present

## 2022-11-11 DIAGNOSIS — Z01419 Encounter for gynecological examination (general) (routine) without abnormal findings: Secondary | ICD-10-CM | POA: Diagnosis not present

## 2022-11-11 DIAGNOSIS — N951 Menopausal and female climacteric states: Secondary | ICD-10-CM | POA: Diagnosis not present

## 2022-11-11 DIAGNOSIS — Z113 Encounter for screening for infections with a predominantly sexual mode of transmission: Secondary | ICD-10-CM | POA: Diagnosis not present

## 2022-12-09 DIAGNOSIS — Z09 Encounter for follow-up examination after completed treatment for conditions other than malignant neoplasm: Secondary | ICD-10-CM | POA: Diagnosis not present

## 2022-12-11 ENCOUNTER — Other Ambulatory Visit: Payer: Self-pay | Admitting: Internal Medicine

## 2022-12-11 ENCOUNTER — Encounter: Payer: Self-pay | Admitting: Dermatology

## 2022-12-11 DIAGNOSIS — K5909 Other constipation: Secondary | ICD-10-CM

## 2022-12-11 NOTE — Telephone Encounter (Signed)
Hi Jetta,  Can we find out what rx they are referring to?  Thanks!

## 2022-12-15 ENCOUNTER — Other Ambulatory Visit: Payer: Self-pay

## 2022-12-15 MED ORDER — ADAPALENE-BENZOYL PEROXIDE 0.3-2.5 % EX GEL: 1.0000 "application " | Freq: Every evening | CUTANEOUS | 2 refills | Status: DC

## 2022-12-15 NOTE — Telephone Encounter (Signed)
HI.  Thanks for the clarificaiton.  Yes, we can resend that rx for her.  Thanks :)

## 2022-12-23 ENCOUNTER — Encounter: Payer: Self-pay | Admitting: Internal Medicine

## 2022-12-23 ENCOUNTER — Encounter: Payer: Federal, State, Local not specified - PPO | Admitting: Internal Medicine

## 2022-12-23 ENCOUNTER — Ambulatory Visit (INDEPENDENT_AMBULATORY_CARE_PROVIDER_SITE_OTHER): Payer: Federal, State, Local not specified - PPO | Admitting: Internal Medicine

## 2022-12-23 VITALS — BP 110/70 | HR 54 | Temp 98.1°F | Ht 62.0 in | Wt 146.3 lb

## 2022-12-23 DIAGNOSIS — Z0000 Encounter for general adult medical examination without abnormal findings: Secondary | ICD-10-CM | POA: Diagnosis not present

## 2022-12-23 DIAGNOSIS — H6121 Impacted cerumen, right ear: Secondary | ICD-10-CM

## 2022-12-23 DIAGNOSIS — Z862 Personal history of diseases of the blood and blood-forming organs and certain disorders involving the immune mechanism: Secondary | ICD-10-CM | POA: Insufficient documentation

## 2022-12-23 NOTE — Assessment & Plan Note (Signed)
AFTER OBTAINING VERBAL CONSENT, RIGHT EAR WAS FLUSHED BY IRRIGATION. SHE TOLERATED PROCEDURE WELL WITHOUT ANY COMPLICATIONS. NO TM ABNORMALITIES WERE NOTED.

## 2022-12-23 NOTE — Progress Notes (Addendum)
I,[name], CMA,acting as a Neurosurgeon for Gwynneth Aliment, MD.,have documented all relevant documentation on the behalf of Gwynneth Aliment, MD,as directed by  Gwynneth Aliment, MD while in the presence of Gwynneth Aliment, MD.  Subjective:    Patient ID: Anna Powell , female    DOB: 01/18/1971 , 52 y.o.   MRN: [id number]  Chief Complaint  Patient presents with   Annual Exam    HPI  Patient here for HM. She is followed by Dr.Dillard for her GYN care. She has no specific complaints/concerns at this time. She admits not taking ferrous sulfate, her original rx has expired. She has been out for about 4-6 weeks.   She would like iron checked today.      Past Medical History:  Diagnosis Date   Bacterial vaginosis    Breast density 2008   right breast    Constipation    Gestational thrombocytopenia (HCC)    ITP (idiopathic thrombocytopenic purpura)      Family History  Problem Relation Age of Onset   Hypertension Maternal Grandmother    Cancer Paternal Grandfather        lung   Heart disease Other        maternal great-grandmother heart attack    Hypertension Mother    Non-Hodgkin's lymphoma Father      Current Outpatient Medications:    Adapalene-Benzoyl Peroxide 0.3-2.5 % GEL, Apply 1 application  topically at bedtime., Disp: 60 g, Rfl: 2   cetirizine-pseudoephedrine (ZYRTEC-D) 5-120 MG tablet, Take 1 tablet by mouth 2 (two) times daily., Disp: , Rfl:    clobetasol cream (TEMOVATE) 0.05 %, , Disp: , Rfl:    fluticasone (FLONASE) 50 MCG/ACT nasal spray, Insert to puffs into nostrils daily., Disp: 15.8 mL, Rfl: 2   HYDROcodone bit-homatropine (HYDROMET) 5-1.5 MG/5ML syrup, Take 5 mLs by mouth every 6 (six) hours as needed for cough., Disp: 120 mL, Rfl: 0   montelukast (SINGULAIR) 10 MG tablet, TAKE 1 TABLET(10 MG) BY MOUTH DAILY, Disp: 90 tablet, Rfl: 1   NOREL AD 4-10-325 MG TABS, Take 1 tablet by mouth 2 times per day, Disp: 20 tablet, Rfl: 0   tacrolimus  (PROTOPIC) 0.1 % ointment, Apply topically 2 (two) times daily. Apply to affected areas, Disp: 100 g, Rfl: 3   triamcinolone (KENALOG) 0.025 % cream, APPLY TOPICALLY TO FACE TWICE DAILY, Disp: , Rfl:    TRULANCE 3 MG TABS, TAKE 1 TABLET(3 MG) BY MOUTH DAILY, Disp: 30 tablet, Rfl: 2   ferrous sulfate 325 (65 FE) MG EC tablet, TAKE 1 TABLET(325 MG) BY MOUTH DAILY WITH BREAKFAST (Patient not taking: Reported on 12/23/2022), Disp: 30 tablet, Rfl: 3   Allergies  Allergen Reactions   Monistat [Miconazole]       The patient states she uses post menopausal status for birth control. Patient's last menstrual period was 05/28/2020.. Negative for Dysmenorrhea. Negative for: breast discharge, breast lump(s), breast pain and breast self exam. Associated symptoms include abnormal vaginal bleeding. Pertinent negatives include abnormal bleeding (hematology), anxiety, decreased libido, depression, difficulty falling sleep, dyspareunia, history of infertility, nocturia, sexual dysfunction, sleep disturbances, urinary incontinence, urinary urgency, vaginal discharge and vaginal itching. Diet regular.The patient states her exercise level is  moderate-strenuous.   . The patient's tobacco use is:  Social History   Tobacco Use  Smoking Status Never  Smokeless Tobacco Never  . She has been exposed to passive smoke. The patient's alcohol use is:  Social History   Substance  and Sexual Activity  Alcohol Use No   Review of Systems  Constitutional: Negative.   HENT: Negative.    Eyes: Negative.   Respiratory: Negative.    Cardiovascular: Negative.   Gastrointestinal: Negative.   Endocrine: Negative.   Genitourinary: Negative.   Musculoskeletal: Negative.   Skin: Negative.   Allergic/Immunologic: Negative.   Neurological: Negative.   Hematological: Negative.   Psychiatric/Behavioral: Negative.       Today's Vitals   12/23/22 0847  BP: 110/70  Pulse: (!) 54  Temp: 98.1 F (36.7 C)  SpO2: 98%  Weight:  146 lb 4.8 oz (66.4 kg)  Height: 5\' 2"  (1.575 m)   Body mass index is 26.76 kg/m.  Wt Readings from Last 3 Encounters:  12/23/22 146 lb 4.8 oz (66.4 kg)  06/30/22 145 lb 11.2 oz (66.1 kg)  06/17/22 138 lb (62.6 kg)     Objective:  Physical Exam Vitals and nursing note reviewed.  Constitutional:      Appearance: Normal appearance.  HENT:     Head: Normocephalic and atraumatic.     Right Ear: Ear canal and external ear normal. There is impacted cerumen.     Left Ear: Tympanic membrane, ear canal and external ear normal.     Nose: Nose normal.     Mouth/Throat:     Mouth: Mucous membranes are moist.     Pharynx: Oropharynx is clear.  Eyes:     Extraocular Movements: Extraocular movements intact.     Conjunctiva/sclera: Conjunctivae normal.     Pupils: Pupils are equal, round, and reactive to light.  Cardiovascular:     Rate and Rhythm: Normal rate and regular rhythm.     Pulses: Normal pulses.     Heart sounds: Normal heart sounds.  Pulmonary:     Effort: Pulmonary effort is normal.     Breath sounds: Normal breath sounds.  Chest:  Breasts:    Tanner Score is 5.     Right: Normal.     Left: Normal.  Abdominal:     General: Abdomen is flat. Bowel sounds are normal.     Palpations: Abdomen is soft.  Genitourinary:    Comments: deferred Musculoskeletal:        General: Normal range of motion.     Cervical back: Normal range of motion and neck supple.  Skin:    General: Skin is warm and dry.     Comments: Scattered areas of hypo- and hyperpigmentation  Neurological:     General: No focal deficit present.     Mental Status: She is alert and oriented to person, place, and time.  Psychiatric:        Mood and Affect: Mood normal.        Behavior: Behavior normal.         Assessment And Plan:     Encounter for general adult medical examination w/o abnormal findings Assessment & Plan: A full exam was performed.  Importance of monthly self breast exams was discussed  with the patient.  She is advised to get 30-45 minutes of regular exercise, no less than four to five days per week. Both weight-bearing and aerobic exercises are recommended.  She is advised to follow a healthy diet with at least six fruits/veggies per day, decrease intake of red meat and other saturated fats and to increase fish intake to twice weekly.  Meats/fish should not be fried -- baked, boiled or broiled is preferable. It is also important to cut back on your  sugar intake.  Be sure to read labels - try to avoid anything with added sugar, high fructose corn syrup or other sweeteners.  If you must use a sweetener, you can try stevia or monkfruit.  It is also important to avoid artificially sweetened foods/beverages and diet drinks. Lastly, wear SPF 50 sunscreen on exposed skin and when in direct sunlight for an extended period of time.  Be sure to avoid fast food restaurants and aim for at least 60 ounces of water daily.      Orders: -     CMP14+EGFR -     CBC -     Lipid panel -     TSH  Impacted cerumen of right ear Assessment & Plan: AFTER OBTAINING VERBAL CONSENT, RIGHT EAR WAS FLUSHED BY IRRIGATION. SHE TOLERATED PROCEDURE WELL WITHOUT ANY COMPLICATIONS. NO TM ABNORMALITIES WERE NOTED.   Orders: -     Ear Lavage  History of anemia -     Iron, TIBC and Ferritin Panel     Return for 1 year physical. . Patient was given opportunity to ask questions. Patient verbalized understanding of the plan and was able to repeat key elements of the plan. All questions were answered to their satisfaction.     I, Gwynneth Aliment, MD, have reviewed all documentation for this visit. The documentation on 12/23/22 for the exam, diagnosis, procedures, and orders are all accurate and complete.

## 2022-12-23 NOTE — Assessment & Plan Note (Signed)

## 2022-12-23 NOTE — Addendum Note (Signed)
Addended by: Gwynneth Aliment on: 12/23/2022 09:53 AM   Modules accepted: Orders, Level of Service

## 2022-12-24 LAB — CMP14+EGFR
ALT: 21 [IU]/L (ref 0–32)
AST: 28 [IU]/L (ref 0–40)
Albumin: 4.4 g/dL (ref 3.8–4.9)
Alkaline Phosphatase: 77 [IU]/L (ref 44–121)
BUN/Creatinine Ratio: 16 (ref 9–23)
BUN: 15 mg/dL (ref 6–24)
Bilirubin Total: 0.3 mg/dL (ref 0.0–1.2)
CO2: 24 mmol/L (ref 20–29)
Calcium: 10.2 mg/dL (ref 8.7–10.2)
Chloride: 103 mmol/L (ref 96–106)
Creatinine, Ser: 0.94 mg/dL (ref 0.57–1.00)
Globulin, Total: 2.7 g/dL (ref 1.5–4.5)
Glucose: 86 mg/dL (ref 70–99)
Potassium: 4.7 mmol/L (ref 3.5–5.2)
Sodium: 141 mmol/L (ref 134–144)
Total Protein: 7.1 g/dL (ref 6.0–8.5)
eGFR: 72 mL/min/{1.73_m2} (ref >59)

## 2022-12-24 LAB — IRON,TIBC AND FERRITIN PANEL
Ferritin: 99 ng/mL (ref 15–150)
Iron Saturation: 38 % (ref 15–55)
Iron: 105 ug/dL (ref 27–159)
Total Iron Binding Capacity: 279 ug/dL (ref 250–450)
UIBC: 174 ug/dL (ref 131–425)

## 2022-12-24 LAB — LIPID PANEL
Chol/HDL Ratio: 2 {ratio} (ref 0.0–4.4)
Cholesterol, Total: 162 mg/dL (ref 100–199)
HDL: 82 mg/dL (ref >39)
LDL Chol Calc (NIH): 71 mg/dL (ref 0–99)
Triglycerides: 40 mg/dL (ref 0–149)
VLDL Cholesterol Cal: 9 mg/dL (ref 5–40)

## 2022-12-24 LAB — CBC
Hematocrit: 39.5 % (ref 34.0–46.6)
Hemoglobin: 12 g/dL (ref 11.1–15.9)
MCH: 27.5 pg (ref 26.6–33.0)
MCHC: 30.4 g/dL — ABNORMAL LOW (ref 31.5–35.7)
MCV: 91 fL (ref 79–97)
RBC: 4.36 x10E6/uL (ref 3.77–5.28)
RDW: 12.4 % (ref 11.7–15.4)
WBC: 4.6 10*3/uL (ref 3.4–10.8)

## 2022-12-24 LAB — TSH: TSH: 1.33 u[IU]/mL (ref 0.450–4.500)

## 2023-01-06 ENCOUNTER — Ambulatory Visit: Payer: Federal, State, Local not specified - PPO | Admitting: Dermatology

## 2023-01-16 ENCOUNTER — Other Ambulatory Visit (HOSPITAL_COMMUNITY): Payer: Self-pay

## 2023-01-16 ENCOUNTER — Other Ambulatory Visit: Payer: Self-pay

## 2023-01-16 DIAGNOSIS — K5909 Other constipation: Secondary | ICD-10-CM

## 2023-01-16 MED ORDER — TRULANCE 3 MG PO TABS
3.0000 mg | ORAL_TABLET | Freq: Every day | ORAL | 2 refills | Status: DC
Start: 2023-01-16 — End: 2023-03-03
  Filled 2023-01-16: qty 30, 30d supply, fill #0

## 2023-02-24 ENCOUNTER — Encounter: Payer: Self-pay | Admitting: Internal Medicine

## 2023-02-25 ENCOUNTER — Encounter: Payer: Self-pay | Admitting: Dermatology

## 2023-02-25 ENCOUNTER — Ambulatory Visit: Payer: Federal, State, Local not specified - PPO | Admitting: Dermatology

## 2023-02-25 VITALS — BP 102/70 | HR 70

## 2023-02-25 DIAGNOSIS — L700 Acne vulgaris: Secondary | ICD-10-CM

## 2023-02-25 DIAGNOSIS — L920 Granuloma annulare: Secondary | ICD-10-CM

## 2023-02-25 DIAGNOSIS — L709 Acne, unspecified: Secondary | ICD-10-CM

## 2023-02-25 MED ORDER — TACROLIMUS 0.1 % EX OINT
TOPICAL_OINTMENT | Freq: Two times a day (BID) | CUTANEOUS | 3 refills | Status: DC
Start: 2023-02-25 — End: 2023-05-26

## 2023-02-25 MED ORDER — TRIAMCINOLONE ACETONIDE 0.025 % EX CREA: TOPICAL_CREAM | Freq: Two times a day (BID) | CUTANEOUS | 5 refills | Status: AC

## 2023-02-25 MED ORDER — ADAPALENE-BENZOYL PEROXIDE 0.3-2.5 % EX GEL
1.0000 | Freq: Every evening | CUTANEOUS | 2 refills | Status: AC
Start: 2023-02-25 — End: ?

## 2023-02-25 NOTE — Progress Notes (Signed)
   Follow-Up Visit   Subjective  Anna Powell is a 52 y.o. female who presents for the following: Acne and Granuloma Annulare  Patient present today for follow up visit for Acne and Granuloma Annulare. Patient was last evaluated on 10/06/22.At this visit pt was prescribed Tacrolimus to apply 2 times daily to the effected areas and for acne she was advised to continue Adapalene-Benzoyl Peroxide. Patient reports sxs are better. Patient denies medication changes.  The following portions of the chart were reviewed this encounter and updated as appropriate: medications, allergies, medical history  Review of Systems:  No other skin or systemic complaints except as noted in HPI or Assessment and Plan.  Objective  Well appearing patient in no apparent distress; mood and affect are within normal limits.  A focused examination was performed of the following areas: Face, Right Upper arm and B/L Legs  Relevant exam findings are noted in the Assessment and Plan.  Assessment & Plan   1. Acne - Assessment: Patient is using Epiduo nightly with a good response. Reports mild dryness in the morning. Currently using CeraVe hyaluronic acid lotion and salicylic acid cleanser. The clinician described the skin appearance as "beautiful," indicating good control of acne. - Plan:   - Continue Epiduo nightly.   - Recommend applying a pure hyaluronic acid serum ([place] or L'Oreal) before Epiduo application.   - Advise to apply moisturizer after medication.   - Continue using CeraVe salicylic acid cleanser.   - Refill Epiduo prescription.   - Include a picture of the recommended hyaluronic acid serum in the after-visit summary.  2. Granuloma Annulare - Assessment: Patient has been using tacrolimus twice daily with some spots persisting, including residual hypopigmentation from the clavicle area. No new lesions reported, but existing areas continue to itch intensely. Tacrolimus provides some relief. The  patient has been using vitamin E oil and tea tree oil for symptom management. The condition is noted to be deep in the dermis, making topical treatments less effective. The unpredictable nature of the condition, which can last for years, is acknowledged. - Plan:   - Continue tacrolimus twice daily, including on the face.   - Alternate between triamcinolone and tacrolimus: 2 weeks of triamcinolone twice daily, followed by 2 weeks of tacrolimus.   - Recommend CeraVe anti-itch products containing pramoxine for additional itch relief.   - Consider intradermal steroid injections for elevated areas in future visits.   - Dietary recommendations: increase intake of antioxidant-rich foods (spinach, kale, green leafy vegetables, blueberries, strawberries, Weinmann's berries) and reduce inflammatory foods (carbs, sugars, red meat).   - Suggest taking a multivitamin and vitamin C supplement.   - Stress management advised.   - Follow-up to monitor progress and consider injection therapy if needed.  No follow-ups on file.    Documentation: I have reviewed the above documentation for accuracy and completeness, and I agree with the above.  Stasia Cavalier, am acting as scribe for Langston Reusing, DO.  Langston Reusing, DO

## 2023-02-25 NOTE — Patient Instructions (Addendum)
Dear Ms. Perrell,  Thank you for visiting my office today. Your dedication to improving your skin health is commendable, and it's a pleasure to see the progress you've made.  Here is a summary of the key instructions from today's consultation:  - Skin Care Routine:   - Epiduo: Continue using as directed.   - Hyaluronic Acid Serum: Apply before Epiduo at night and before moisturizer in the morning. Consider switching to a pure form, such as those by [place] or L'Oreal.   - CeraVe Products: Continue with the acne treatment containing salicylic acid.  - Granuloma Annulare Management:   - Tacrolimus Ointment: Continue applying twice daily on affected areas, excluding the face.   - Triamcinolone: For intense itching, apply twice daily for two weeks, followed by a two-week break. Use alternately with tacrolimus.   - CeraVe's Anti-Itch Line: Consider for additional relief.   - Intradermal Steroid Injections: Discuss this option for persistent or elevated granulomas during your next visit.  - General Health Recommendations:   - Diet: Increase intake of antioxidants (spinach, kale, berries) and reduce consumption of carbohydrates, sugars, and red meat.   - Supplements: Ensure adequate intake of multivitamins and vitamin C.   - Stress Management: Implement effective strategies.  - Medication Refills:   - Refills for Epiduo and hyaluronic acid will be processed.  Please adhere to the outlined regimen and monitor your skin's response. We will assess your progress and potentially adjust your treatment during your next appointment.  Warm regards,  Dr. Langston Reusing,  Dermatologist       Important Information   Due to recent changes in healthcare laws, you may see results of your pathology and/or laboratory studies on MyChart before the doctors have had a chance to review them. We understand that in some cases there may be results that are confusing or concerning to you. Please understand that  not all results are received at the same time and often the doctors may need to interpret multiple results in order to provide you with the best plan of care or course of treatment. Therefore, we ask that you please give [place] 2 business days to thoroughly review all your results before contacting the office for clarification. Should we see a critical lab result, you will be contacted sooner.     If You Need Anything After Your Visit   If you have any questions or concerns for your doctor, please call our main line at [phone]. If no one answers, please leave a voicemail as directed and we will return your call as soon as possible. Messages left after 4 pm will be answered the following business day.    You may also send [place] a message via MyChart. We typically respond to MyChart messages within 1-2 business days.  For prescription refills, please ask your pharmacy to contact our office. Our fax number is [phone].  If you have an urgent issue when the clinic is closed that cannot wait until the next business day, you can page your doctor at the number below.     Please note that while we do our best to be available for urgent issues outside of office hours, we are not available 24/7.    If you have an urgent issue and are unable to reach [place], you may choose to seek medical care at your doctor's office, retail clinic, urgent care center, or emergency room.   If you have a medical emergency, please immediately call 911 or go to the  emergency department. In the event of inclement weather, please call our main line at [phone] for an update on the status of any delays or closures.  Dermatology Medication Tips: Please keep the boxes that topical medications come in in order to help keep track of the instructions about where and how to use these. Pharmacies typically print the medication instructions only on the boxes and not directly on the medication tubes.   If your medication is too  expensive, please contact our office at [phone] or send [place] a message through MyChart.    We are unable to tell what your co-pay for medications will be in advance as this is different depending on your insurance coverage. However, we may be able to find a substitute medication at lower cost or fill out paperwork to get insurance to cover a needed medication.    If a prior authorization is required to get your medication covered by your insurance company, please allow [place] 1-2 business days to complete this process.   Drug prices often vary depending on where the prescription is filled and some pharmacies may offer cheaper prices.   The website www.goodrx.com contains coupons for medications through different pharmacies. The prices here do not account for what the cost may be with help from insurance (it may be cheaper with your insurance), but the website can give you the price if you did not use any insurance.  - You can print the associated coupon and take it with your prescription to the pharmacy.  - You may also stop by our office during regular business hours and pick up a GoodRx coupon card.  - If you need your prescription sent electronically to a different pharmacy, notify our office through [facility] or by phone at [phone]

## 2023-02-26 ENCOUNTER — Telehealth: Payer: Self-pay

## 2023-02-26 NOTE — Telephone Encounter (Signed)
Prior Auth for trulance 3mg  has been submitted over the phone we are waiting on the determination. Pt has been notified YL,RMA

## 2023-03-03 ENCOUNTER — Other Ambulatory Visit: Payer: Self-pay

## 2023-03-03 DIAGNOSIS — K5909 Other constipation: Secondary | ICD-10-CM

## 2023-03-03 MED ORDER — TRULANCE 3 MG PO TABS
3.0000 mg | ORAL_TABLET | Freq: Every day | ORAL | 2 refills | Status: DC
Start: 2023-03-03 — End: 2023-05-12

## 2023-03-22 ENCOUNTER — Other Ambulatory Visit: Payer: Self-pay | Admitting: Internal Medicine

## 2023-03-22 MED ORDER — NOREL AD 4-10-325 MG PO TABS: ORAL_TABLET | ORAL | 1 refills | Status: AC

## 2023-03-31 ENCOUNTER — Other Ambulatory Visit: Payer: Self-pay

## 2023-03-31 MED ORDER — FLUTICASONE PROPIONATE 50 MCG/ACT NA SUSP: NASAL | 2 refills | Status: DC

## 2023-04-23 ENCOUNTER — Ambulatory Visit: Payer: Federal, State, Local not specified - PPO | Admitting: Internal Medicine

## 2023-04-23 VITALS — BP 108/70 | HR 68 | Temp 98.1°F | Ht 62.0 in

## 2023-04-23 DIAGNOSIS — R202 Paresthesia of skin: Secondary | ICD-10-CM | POA: Diagnosis not present

## 2023-04-23 DIAGNOSIS — R42 Dizziness and giddiness: Secondary | ICD-10-CM | POA: Diagnosis not present

## 2023-04-23 NOTE — Progress Notes (Signed)
I,Victoria T Deloria Lair, CMA,acting as a Neurosurgeon for Gwynneth Aliment, MD.,have documented all relevant documentation on the behalf of Gwynneth Aliment, MD,as directed by  Gwynneth Aliment, MD while in the presence of Gwynneth Aliment, MD.  Subjective:  Patient ID: Anna Powell , female    DOB: 1970/11/22 , 53 y.o.   MRN: [id number]  Chief Complaint  Patient presents with   Dizziness    HPI  Patient presents today complaining of lightheaded & dizziness. She states this issue began yesterday. She drinks 100oz of water per day. She is not sure what may have triggered her sx. Denies n/v/d. No fever/chills.      Past Medical History:  Diagnosis Date   Bacterial vaginosis    Breast density 2008   right breast    Constipation    Gestational thrombocytopenia (HCC)    ITP (idiopathic thrombocytopenic purpura)      Family History  Problem Relation Age of Onset   Hypertension Maternal Grandmother    Cancer Paternal Grandfather        lung   Heart disease Other        maternal great-grandmother heart attack    Hypertension Mother    Non-Hodgkin's lymphoma Father      Current Outpatient Medications:    Adapalene-Benzoyl Peroxide 0.3-2.5 % GEL, Apply 1 application  topically at bedtime., Disp: 60 g, Rfl: 2   Chlorphen-PE-Acetaminophen (NOREL AD) 4-10-325 MG TABS, Take 1 tablet by mouth 2 times per day prn, Disp: 20 tablet, Rfl: 1   fluticasone (FLONASE) 50 MCG/ACT nasal spray, Insert to puffs into nostrils daily., Disp: 15.8 mL, Rfl: 2   montelukast (SINGULAIR) 10 MG tablet, TAKE 1 TABLET(10 MG) BY MOUTH DAILY, Disp: 90 tablet, Rfl: 1   Plecanatide (TRULANCE) 3 MG TABS, Take 1 tablet (3 mg total) by mouth daily., Disp: 30 tablet, Rfl: 2   tacrolimus (PROTOPIC) 0.1 % ointment, Apply topically 2 (two) times daily. Apply to affected areas, Disp: 100 g, Rfl: 3   triamcinolone (KENALOG) 0.025 % cream, Apply topically 2 (two) times daily., Disp: 60 g, Rfl: 5   clobetasol cream (TEMOVATE)  0.05 %, , Disp: , Rfl:    ferrous sulfate 325 (65 FE) MG EC tablet, TAKE 1 TABLET(325 MG) BY MOUTH DAILY WITH BREAKFAST (Patient not taking: Reported on 04/23/2023), Disp: 30 tablet, Rfl: 3   HYDROcodone bit-homatropine (HYDROMET) 5-1.5 MG/5ML syrup, Take 5 mLs by mouth every 6 (six) hours as needed for cough. (Patient not taking: Reported on 04/23/2023), Disp: 120 mL, Rfl: 0   Allergies  Allergen Reactions   Monistat [Miconazole]      Review of Systems  Constitutional: Negative.   Respiratory: Negative.    Cardiovascular: Negative.   Gastrointestinal: Negative.   Neurological:  Positive for numbness.  Psychiatric/Behavioral: Negative.       Today's Vitals   04/23/23 1405  BP: 108/70  Pulse: 68  Temp: 98.1 F (36.7 C)  SpO2: 98%  Height: 5\' 2"  (1.575 m)   Body mass index is 26.76 kg/m.  Wt Readings from Last 3 Encounters:  12/23/22 146 lb 4.8 oz (66.4 kg)  06/30/22 145 lb 11.2 oz (66.1 kg)  06/17/22 138 lb (62.6 kg)     Objective:  Physical Exam Vitals and nursing note reviewed.  Constitutional:      Appearance: Normal appearance.  HENT:     Head: Normocephalic and atraumatic.     Right Ear: Tympanic membrane, ear canal and external ear normal. There  is no impacted cerumen.     Left Ear: Tympanic membrane, ear canal and external ear normal. There is no impacted cerumen.  Eyes:     Extraocular Movements: Extraocular movements intact.  Cardiovascular:     Rate and Rhythm: Normal rate and regular rhythm.     Heart sounds: Normal heart sounds.  Pulmonary:     Effort: Pulmonary effort is normal.     Breath sounds: Normal breath sounds.  Musculoskeletal:     Cervical back: Normal range of motion.  Skin:    General: Skin is warm.  Neurological:     General: No focal deficit present.     Mental Status: She is alert.  Psychiatric:        Mood and Affect: Mood normal.        Behavior: Behavior normal.         Assessment And Plan:  Lightheadedness Assessment &  Plan: She is advised that she may have electrolyte imbalance due to excessive water intake. I will check Bmp as below. She is encouraged to consume one electrolyte drink after each workout. Once she incorporates this, she will let me know if her sx persist.   Orders: -     BMP8+eGFR  Paresthesia of left leg Assessment & Plan: I will check labs as below. If persistent, will consider imaging of lower back. 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   Orders: -     Vitamin B12     Return if symptoms worsen or fail to improve.  Patient was given opportunity to ask questions. Patient verbalized understanding of the plan and was able to repeat key elements of the plan. All questions were answered to their satisfaction.    I, Gwynneth Aliment, MD, have reviewed all documentation for this visit. The documentation on 04/23/23 for the exam, diagnosis, procedures, and orders are all accurate and complete.   IF YOU HAVE BEEN REFERRED TO A SPECIALIST, IT MAY TAKE 1-2 WEEKS TO SCHEDULE/PROCESS THE REFERRAL. IF YOU HAVE NOT HEARD FROM US/SPECIALIST IN TWO WEEKS, PLEASE GIVE [place] A CALL AT [phone] X [phone].   THE PATIENT IS ENCOURAGED TO PRACTICE SOCIAL DISTANCING DUE TO THE COVID-19 PANDEMIC.

## 2023-04-24 LAB — BMP8+EGFR
BUN/Creatinine Ratio: 13 (ref 9–23)
BUN: 14 mg/dL (ref 6–24)
CO2: 26 mmol/L (ref 20–29)
Calcium: 9.6 mg/dL (ref 8.7–10.2)
Chloride: 104 mmol/L (ref 96–106)
Creatinine, Ser: 1.09 mg/dL — ABNORMAL HIGH (ref 0.57–1.00)
Glucose: 89 mg/dL (ref 70–99)
Potassium: 4.6 mmol/L (ref 3.5–5.2)
Sodium: 143 mmol/L (ref 134–144)
eGFR: 60 mL/min/{1.73_m2} (ref >59)

## 2023-04-24 LAB — VITAMIN B12: Vitamin B-12: 935 pg/mL (ref 232–1245)

## 2023-04-28 ENCOUNTER — Other Ambulatory Visit: Payer: Self-pay | Admitting: Internal Medicine

## 2023-04-28 DIAGNOSIS — R42 Dizziness and giddiness: Secondary | ICD-10-CM

## 2023-05-02 DIAGNOSIS — R42 Dizziness and giddiness: Secondary | ICD-10-CM | POA: Insufficient documentation

## 2023-05-02 DIAGNOSIS — R202 Paresthesia of skin: Secondary | ICD-10-CM | POA: Insufficient documentation

## 2023-05-02 NOTE — Assessment & Plan Note (Signed)
She is advised that she may have electrolyte imbalance due to excessive water intake. I will check Bmp as below. She is encouraged to consume one electrolyte drink after each workout. Once she incorporates this, she will let me know if her sx persist.

## 2023-05-02 NOTE — Assessment & Plan Note (Signed)
I will check labs as below. If persistent, will consider imaging of lower back. 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

## 2023-05-11 ENCOUNTER — Other Ambulatory Visit: Payer: Self-pay | Admitting: Internal Medicine

## 2023-05-11 DIAGNOSIS — K5909 Other constipation: Secondary | ICD-10-CM

## 2023-05-20 ENCOUNTER — Ambulatory Visit: Admitting: Internal Medicine

## 2023-05-20 DIAGNOSIS — F4323 Adjustment disorder with mixed anxiety and depressed mood: Secondary | ICD-10-CM | POA: Diagnosis not present

## 2023-05-20 NOTE — Progress Notes (Deleted)
 I,Karimah Winquist T Deloria Lair, CMA,acting as a Neurosurgeon for Gwynneth Aliment, MD.,have documented all relevant documentation on the behalf of Gwynneth Aliment, MD,as directed by  Gwynneth Aliment, MD while in the presence of Gwynneth Aliment, MD.  Subjective:  Patient ID: Anna Powell , female    DOB: 1970/07/19 , 53 y.o.   MRN: [id number]  No chief complaint on file.   HPI  Patient presents today for     Past Medical History:  Diagnosis Date  . Bacterial vaginosis   . Breast density 2008   right breast   . Constipation   . Gestational thrombocytopenia (HCC)   . ITP (idiopathic thrombocytopenic purpura)      Family History  Problem Relation Age of Onset  . Hypertension Maternal Grandmother   . Cancer Paternal Grandfather        lung  . Heart disease Other        maternal great-grandmother heart attack   . Hypertension Mother   . Non-Hodgkin's lymphoma Father      Current Outpatient Medications:  .  Adapalene-Benzoyl Peroxide 0.3-2.5 % GEL, Apply 1 application  topically at bedtime., Disp: 60 g, Rfl: 2 .  Chlorphen-PE-Acetaminophen (NOREL AD) 4-10-325 MG TABS, Take 1 tablet by mouth 2 times per day prn, Disp: 20 tablet, Rfl: 1 .  clobetasol cream (TEMOVATE) 0.05 %, , Disp: , Rfl:  .  ferrous sulfate 325 (65 FE) MG EC tablet, TAKE 1 TABLET(325 MG) BY MOUTH DAILY WITH BREAKFAST (Patient not taking: Reported on 04/23/2023), Disp: 30 tablet, Rfl: 3 .  fluticasone (FLONASE) 50 MCG/ACT nasal spray, Insert to puffs into nostrils daily., Disp: 15.8 mL, Rfl: 2 .  HYDROcodone bit-homatropine (HYDROMET) 5-1.5 MG/5ML syrup, Take 5 mLs by mouth every 6 (six) hours as needed for cough. (Patient not taking: Reported on 04/23/2023), Disp: 120 mL, Rfl: 0 .  montelukast (SINGULAIR) 10 MG tablet, TAKE 1 TABLET(10 MG) BY MOUTH DAILY, Disp: 90 tablet, Rfl: 1 .  tacrolimus (PROTOPIC) 0.1 % ointment, Apply topically 2 (two) times daily. Apply to affected areas, Disp: 100 g, Rfl: 3 .  triamcinolone (KENALOG)  0.025 % cream, Apply topically 2 (two) times daily., Disp: 60 g, Rfl: 5 .  TRULANCE 3 MG TABS, TAKE 1 TABLET(3 MG) BY MOUTH DAILY, Disp: 30 tablet, Rfl: 2   Allergies  Allergen Reactions  . Monistat [Miconazole]      Review of Systems  Constitutional: Negative.   Respiratory: Negative.    Cardiovascular: Negative.   Neurological: Negative.   Psychiatric/Behavioral: Negative.      There were no vitals filed for this visit. There is no height or weight on file to calculate BMI.  Wt Readings from Last 3 Encounters:  12/23/22 146 lb 4.8 oz (66.4 kg)  06/30/22 145 lb 11.2 oz (66.1 kg)  06/17/22 138 lb (62.6 kg)     Objective:  Physical Exam      Assessment And Plan:  There are no diagnoses linked to this encounter.   No follow-ups on file.  Patient was given opportunity to ask questions. Patient verbalized understanding of the plan and was able to repeat key elements of the plan. All questions were answered to their satisfaction.  Gwynneth Aliment, MD  I, Gwynneth Aliment, MD, have reviewed all documentation for this visit. The documentation on 05/20/23 for the exam, diagnosis, procedures, and orders are all accurate and complete.   IF YOU HAVE BEEN REFERRED TO A SPECIALIST, IT MAY TAKE 1-2 WEEKS  TO SCHEDULE/PROCESS THE REFERRAL. IF YOU HAVE NOT HEARD FROM US/SPECIALIST IN TWO WEEKS, PLEASE GIVE [place] A CALL AT [phone].   THE PATIENT IS ENCOURAGED TO PRACTICE SOCIAL DISTANCING DUE TO THE COVID-19 PANDEMIC.

## 2023-05-26 ENCOUNTER — Ambulatory Visit (INDEPENDENT_AMBULATORY_CARE_PROVIDER_SITE_OTHER): Payer: Federal, State, Local not specified - PPO | Admitting: Dermatology

## 2023-05-26 ENCOUNTER — Encounter: Payer: Self-pay | Admitting: Dermatology

## 2023-05-26 DIAGNOSIS — L700 Acne vulgaris: Secondary | ICD-10-CM | POA: Diagnosis not present

## 2023-05-26 DIAGNOSIS — L920 Granuloma annulare: Secondary | ICD-10-CM

## 2023-05-26 MED ORDER — TACROLIMUS 0.1 % EX OINT
TOPICAL_OINTMENT | Freq: Two times a day (BID) | CUTANEOUS | 3 refills | Status: AC
Start: 2023-05-26 — End: ?

## 2023-05-26 NOTE — Patient Instructions (Addendum)
 Hello  M. Monteith,  Thank you for visiting my office today.  Here is a summary of the key instructions we discussed:  - Granuloma Annulare & Adult Acne Management:   - Continue using Tacrolimus on the left leg and avoid using Clobetasol in that area to allow hypopigmentation to recover.   - For shoulder spots and any worsening or new spots of Granuloma Annulare, alternate between Clobetasol and Tacrolimus.   - Maintain your current routine with Epiduo for your acne. Consider reducing application to every other night if you experience burning during exercise. Use Vichy and Cicoplast Healing Balm on alternate nights to manage sensitivity.   - Apply sunscreen regularly, especially while on vacation. Recommended sunscreen is by CeraVe or Excedrin for sensitive skin, reapplying every 2 to 3 hours.  - General Advice:   - The sun may benefit your skin condition slightly; however, ensure to stay protected.   - Take a break from Epiduo while on vacation but bring it along for spot treatment if necessary.  - Follow-Up:   - Schedule a follow-up appointment in 6 months to assess the progress and adjust the treatment plan as needed.  Please enjoy your upcoming vacation in [place] and take the necessary precautions to protect your skin. If you have any further questions or concerns, feel free to contact our office.  Warm regards,  Dr. [facility]   Important Information  Due to recent changes in healthcare laws, you may see results of your pathology and/or laboratory studies on MyChart before the doctors have had a chance to review them. We understand that in some cases there may be results that are confusing or concerning to you. Please understand that not all results are received at the same time and often the doctors may need to interpret multiple results in order to provide you with the best plan of care or course of treatment. Therefore, we ask that you please give [place] 2 business  days to thoroughly review all your results before contacting the office for clarification. Should we see a critical lab result, you will be contacted sooner.   If You Need Anything After Your Visit  If you have any questions or concerns for your doctor, please call our main line at [phone] If no one answers, please leave a voicemail as directed and we will return your call as soon as possible. Messages left after 4 pm will be answered the following business day.   You may also send [place] a message via MyChart. We typically respond to MyChart messages within 1-2 business days.  For prescription refills, please ask your pharmacy to contact our office. Our fax number is [phone].  If you have an urgent issue when the clinic is closed that cannot wait until the next business day, you can page your doctor at the number below.    Please note that while we do our best to be available for urgent issues outside of office hours, we are not available 24/7.   If you have an urgent issue and are unable to reach [place], you may choose to seek medical care at your doctor's office, retail clinic, urgent care center, or emergency room.  If you have a medical emergency, please immediately call 911 or go to the emergency department. In the event of inclement weather, please call our main line at [phone] for an update on the status of any delays or closures.  Dermatology Medication Tips: Please keep the boxes that topical medications  come in in order to help keep track of the instructions about where and how to use these. Pharmacies typically print the medication instructions only on the boxes and not directly on the medication tubes.   If your medication is too expensive, please contact our office at [phone] or send [place] a message through MyChart.   We are unable to tell what your co-pay for medications will be in advance as this is different depending on your insurance coverage. However, we may be  able to find a substitute medication at lower cost or fill out paperwork to get insurance to cover a needed medication.   If a prior authorization is required to get your medication covered by your insurance company, please allow [place] 1-2 business days to complete this process.  Drug prices often vary depending on where the prescription is filled and some pharmacies may offer cheaper prices.  The website www.goodrx.com contains coupons for medications through different pharmacies. The prices here do not account for what the cost may be with help from insurance (it may be cheaper with your insurance), but the website can give you the price if you did not use any insurance.  - You can print the associated coupon and take it with your prescription to the pharmacy.  - You may also stop by our office during regular business hours and pick up a GoodRx coupon card.  - If you need your prescription sent electronically to a different pharmacy, notify our office through [facility] or by phone at [phone]

## 2023-05-26 NOTE — Progress Notes (Signed)
   Follow-Up Visit   Subjective  Anna Powell is a 53 y.o. female who presents for the following: Granuloma Annulare - Acne  Patient present today for follow up visit. Patient was last evaluated on 02/25/23. At this visit patient was prescribed Tacrolimus, TMC & EpiDuo. She is needing a refill of Tacrolimus today. Patient reports sxs are better. Patient denies medication changes.  The following portions of the chart were reviewed this encounter and updated as appropriate: medications, allergies, medical history  Review of Systems:  No other skin or systemic complaints except as noted in HPI or Assessment and Plan.  Objective  Well appearing patient in no apparent distress; mood and affect are within normal limits.   A focused examination was performed of the following areas: legs   Relevant exam findings are noted in the Assessment and Plan.         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 Shoulder area still affected and showing hypopigmentation. Leg lesions improving faster than usual for this condition. Clinician notes the condition may be starting to burn itself out. - Plan:    Continue tacrolimus daily on all affected areas except shoulder    Alternate clobetasol and tacrolimus on shoulder lesions    If condition worsens, becomes thicker, elevated, or new spots appear, alternate clobetasol and tacrolimus on all affected areas    Discontinue all treatments once pigment returns and GA resolves    Follow-up in 6 months  2. Adult Acne - Assessment: Patient's skin has shown significant improvement with Epiduo treatment. Patient reports burning sensation when sweating during workouts, likely due to increased skin cell turnover from adapalene and increased sensitivity from benzoyl peroxide. - Plan:    Continue Epiduo, reducing  application to every other night    Use Vichy and Cicoplast Healing Balm on nights with and without Epiduo    Consider pausing Epiduo during vacation, but take for spot treatment if needed    Use sunscreen, recommended CeraVe half mineral, half organic chemical filter    Follow-up in a couple of months ACNE VULGARIS   Related Medications Adapalene-Benzoyl Peroxide 0.3-2.5 % GEL Apply 1 application  topically at bedtime. GRANULOMA ANNULARE   Related Medications triamcinolone (KENALOG) 0.025 % cream Apply topically 2 (two) times daily. tacrolimus (PROTOPIC) 0.1 % ointment Apply topically 2 (two) times daily. Apply to affected areas  No follow-ups on file.    Documentation: I have reviewed the above documentation for accuracy and completeness, and I agree with the above.  I, Shirron Marcha Solders, CMA, am acting as scribe for [employer], DO.   Langston Reusing, DO

## 2023-05-27 DIAGNOSIS — F4323 Adjustment disorder with mixed anxiety and depressed mood: Secondary | ICD-10-CM | POA: Diagnosis not present

## 2023-06-03 DIAGNOSIS — F4323 Adjustment disorder with mixed anxiety and depressed mood: Secondary | ICD-10-CM | POA: Diagnosis not present

## 2023-06-09 DIAGNOSIS — F4323 Adjustment disorder with mixed anxiety and depressed mood: Secondary | ICD-10-CM | POA: Diagnosis not present

## 2023-06-24 DIAGNOSIS — F4323 Adjustment disorder with mixed anxiety and depressed mood: Secondary | ICD-10-CM | POA: Diagnosis not present

## 2023-07-01 DIAGNOSIS — F4323 Adjustment disorder with mixed anxiety and depressed mood: Secondary | ICD-10-CM | POA: Diagnosis not present

## 2023-07-08 DIAGNOSIS — F4323 Adjustment disorder with mixed anxiety and depressed mood: Secondary | ICD-10-CM | POA: Diagnosis not present

## 2023-07-14 DIAGNOSIS — F4323 Adjustment disorder with mixed anxiety and depressed mood: Secondary | ICD-10-CM | POA: Diagnosis not present

## 2023-07-21 DIAGNOSIS — F4323 Adjustment disorder with mixed anxiety and depressed mood: Secondary | ICD-10-CM | POA: Diagnosis not present

## 2023-07-28 DIAGNOSIS — F4323 Adjustment disorder with mixed anxiety and depressed mood: Secondary | ICD-10-CM | POA: Diagnosis not present

## 2023-08-04 DIAGNOSIS — F4323 Adjustment disorder with mixed anxiety and depressed mood: Secondary | ICD-10-CM | POA: Diagnosis not present

## 2023-08-17 DIAGNOSIS — F4323 Adjustment disorder with mixed anxiety and depressed mood: Secondary | ICD-10-CM | POA: Diagnosis not present

## 2023-08-22 ENCOUNTER — Other Ambulatory Visit: Payer: Self-pay | Admitting: Internal Medicine

## 2023-08-22 DIAGNOSIS — K5909 Other constipation: Secondary | ICD-10-CM

## 2023-08-26 ENCOUNTER — Encounter: Payer: Self-pay | Admitting: Family Medicine

## 2023-08-26 ENCOUNTER — Ambulatory Visit: Admitting: Family Medicine

## 2023-08-26 VITALS — BP 110/60 | HR 78 | Ht 62.0 in | Wt 156.0 lb

## 2023-08-26 DIAGNOSIS — A499 Bacterial infection, unspecified: Secondary | ICD-10-CM

## 2023-08-26 DIAGNOSIS — R300 Dysuria: Secondary | ICD-10-CM | POA: Diagnosis not present

## 2023-08-26 DIAGNOSIS — N390 Urinary tract infection, site not specified: Secondary | ICD-10-CM

## 2023-08-26 LAB — POCT URINALYSIS DIP (CLINITEK)
Bilirubin, UA: NEGATIVE
Glucose, UA: NEGATIVE mg/dL
Ketones, POC UA: NEGATIVE mg/dL
Nitrite, UA: POSITIVE — AB
POC PROTEIN,UA: NEGATIVE
Spec Grav, UA: 1.01 (ref 1.010–1.025)
Urobilinogen, UA: 0.2 U/dL
pH, UA: 6.5 (ref 5.0–8.0)

## 2023-08-26 MED ORDER — NITROFURANTOIN MONOHYD MACRO 100 MG PO CAPS
100.0000 mg | ORAL_CAPSULE | Freq: Two times a day (BID) | ORAL | 0 refills | Status: AC
Start: 2023-08-26 — End: 2023-08-31

## 2023-08-26 NOTE — Progress Notes (Signed)
 I,[name], CMA,acting as a Neurosurgeon for [employer], NP.,have documented all relevant documentation on the behalf of Melodie Spry, NP,as directed by  Melodie Spry, NP while in the presence of Melodie Spry, NP.  Subjective:  Patient ID: Anna Powell , female    DOB: [birth year] , 53 y.o.   MRN: [id number]  Chief Complaint  Patient presents with   Dysuria    HPI  Patient is a 53 year old who presents today for evaluation of dysuria. She states that her symptoms started two days ago and she has been urinating frequently. Patient reports that she took AZO over the counter with mild symptom relief.      Past Medical History:  Diagnosis Date   Bacterial vaginosis    Breast density 2008   right breast    Constipation    Gestational thrombocytopenia (HCC)    ITP (idiopathic thrombocytopenic purpura)      Family History  Problem Relation Age of Onset   Hypertension Maternal Grandmother    Cancer Paternal Grandfather        lung   Heart disease Other        maternal great-grandmother heart attack    Hypertension Mother    Non-Hodgkin's lymphoma Father      Current Outpatient Medications:    Adapalene -Benzoyl Peroxide  0.3-2.5 % GEL, Apply 1 application  topically at bedtime., Disp: 60 g, Rfl: 2   Chlorphen-PE-Acetaminophen (NOREL AD) 4-10-325 MG TABS, Take 1 tablet by mouth 2 times per day prn, Disp: 20 tablet, Rfl: 1   clobetasol cream (TEMOVATE) 0.05 %, , Disp: , Rfl:    ferrous sulfate  325 (65 FE) MG EC tablet, TAKE 1 TABLET(325 MG) BY MOUTH DAILY WITH BREAKFAST, Disp: 30 tablet, Rfl: 3   fluticasone  (FLONASE ) 50 MCG/ACT nasal spray, Insert to puffs into nostrils daily., Disp: 15.8 mL, Rfl: 2   HYDROcodone  bit-homatropine (HYDROMET) 5-1.5 MG/5ML syrup, Take 5 mLs by mouth every 6 (six) hours as needed for cough., Disp: 120 mL, Rfl: 0   montelukast  (SINGULAIR ) 10 MG tablet, TAKE 1 TABLET(10 MG) BY MOUTH DAILY, Disp: 90 tablet, Rfl: 1   nitrofurantoin ,  macrocrystal-monohydrate, (MACROBID ) 100 MG capsule, Take 1 capsule (100 mg total) by mouth 2 (two) times daily for 5 days., Disp: 10 capsule, Rfl: 0   tacrolimus  (PROTOPIC ) 0.1 % ointment, Apply topically 2 (two) times daily. Apply to affected areas, Disp: 100 g, Rfl: 3   triamcinolone  (KENALOG ) 0.025 % cream, Apply topically 2 (two) times daily., Disp: 60 g, Rfl: 5   TRULANCE  3 MG TABS, TAKE 1 TABLET(3 MG) BY MOUTH DAILY, Disp: 30 tablet, Rfl: 2   Allergies  Allergen Reactions   Monistat [Miconazole]      Review of Systems  Constitutional: Negative.   HENT: Negative.    Respiratory: Negative.    Cardiovascular: Negative.   Gastrointestinal: Negative.   Genitourinary:  Positive for dysuria.  Musculoskeletal: Negative.      Today's Vitals   08/26/23 1141  BP: 110/60  Pulse: 78  Weight: 156 lb (70.8 kg)  Height: 5' 2 (1.575 m)  PainSc: 0-No pain   Body mass index is 28.53 kg/m.  Wt Readings from Last 3 Encounters:  08/26/23 156 lb (70.8 kg)  12/23/22 146 lb 4.8 oz (66.4 kg)  06/30/22 145 lb 11.2 oz (66.1 kg)    The 10-year ASCVD risk score (Arnett DK, et al., 2019) is: 0.9%   Values used to calculate the score:     Age:  52 years     Sex: Female     Is Non-Hispanic African American: Yes     Diabetic: No     Tobacco smoker: No     Systolic Blood Pressure: 110 mmHg     Is BP treated: No     HDL Cholesterol: 82 mg/dL     Total Cholesterol: 162 mg/dL  Objective:  Physical Exam HENT:     Head: Normocephalic.  Eyes:     Pupils: Pupils are equal, round, and reactive to light.  Cardiovascular:     Rate and Rhythm: Normal rate.  Pulmonary:     Effort: Pulmonary effort is normal.  Abdominal:     General: Bowel sounds are normal.  Neurological:     General: No focal deficit present.     Mental Status: She is alert and oriented to person, place, and time.         Assessment And Plan:  Dysuria -     POCT URINALYSIS DIP (CLINITEK)  UTI (urinary tract infection),  bacterial -     Nitrofurantoin  Monohyd Macro; Take 1 capsule (100 mg total) by mouth 2 (two) times daily for 5 days.  Dispense: 10 capsule; Refill: 0    Return if symptoms worsen or fail to improve.  Patient was given opportunity to ask questions. Patient verbalized understanding of the plan and was able to repeat key elements of the plan. All questions were answered to their satisfaction.   I, Melodie Spry, NP, have reviewed all documentation for this visit. The documentation on 08/26/2023 for the exam, diagnosis, procedures, and orders are all accurate and complete.    IF YOU HAVE BEEN REFERRED TO A SPECIALIST, IT MAY TAKE 1-2 WEEKS TO SCHEDULE/PROCESS THE REFERRAL. IF YOU HAVE NOT HEARD FROM US /SPECIALIST IN TWO WEEKS, PLEASE GIVE US  A CALL AT [phone].

## 2023-08-31 DIAGNOSIS — F4323 Adjustment disorder with mixed anxiety and depressed mood: Secondary | ICD-10-CM | POA: Diagnosis not present

## 2023-09-08 DIAGNOSIS — F4323 Adjustment disorder with mixed anxiety and depressed mood: Secondary | ICD-10-CM | POA: Diagnosis not present

## 2023-09-14 ENCOUNTER — Other Ambulatory Visit: Payer: Self-pay | Admitting: Internal Medicine

## 2023-09-14 DIAGNOSIS — Z1231 Encounter for screening mammogram for malignant neoplasm of breast: Secondary | ICD-10-CM

## 2023-09-21 ENCOUNTER — Other Ambulatory Visit: Payer: Self-pay | Admitting: Medical Genetics

## 2023-09-22 ENCOUNTER — Other Ambulatory Visit

## 2023-09-22 DIAGNOSIS — F4323 Adjustment disorder with mixed anxiety and depressed mood: Secondary | ICD-10-CM | POA: Diagnosis not present

## 2023-09-22 DIAGNOSIS — Z006 Encounter for examination for normal comparison and control in clinical research program: Secondary | ICD-10-CM

## 2023-09-28 ENCOUNTER — Ambulatory Visit
Admission: RE | Admit: 2023-09-28 | Discharge: 2023-09-28 | Disposition: A | Discharge status: Home / Self Care | Source: Ambulatory Visit

## 2023-09-28 DIAGNOSIS — Z1231 Encounter for screening mammogram for malignant neoplasm of breast: Secondary | ICD-10-CM

## 2023-10-03 ENCOUNTER — Other Ambulatory Visit: Payer: Self-pay | Admitting: Internal Medicine

## 2023-10-03 DIAGNOSIS — K5909 Other constipation: Secondary | ICD-10-CM

## 2023-10-03 LAB — GENECONNECT MOLECULAR SCREEN: Genetic Analysis Overall Interpretation: NEGATIVE

## 2023-10-06 DIAGNOSIS — F4323 Adjustment disorder with mixed anxiety and depressed mood: Secondary | ICD-10-CM | POA: Diagnosis not present

## 2023-10-07 ENCOUNTER — Other Ambulatory Visit: Payer: Self-pay | Admitting: Internal Medicine

## 2023-10-16 ENCOUNTER — Ambulatory Visit (INDEPENDENT_AMBULATORY_CARE_PROVIDER_SITE_OTHER): Admitting: Family Medicine

## 2023-10-16 ENCOUNTER — Encounter: Payer: Self-pay | Admitting: Family Medicine

## 2023-10-16 VITALS — BP 110/60 | HR 77 | Ht 62.0 in | Wt 160.0 lb

## 2023-10-16 DIAGNOSIS — M5416 Radiculopathy, lumbar region: Secondary | ICD-10-CM | POA: Diagnosis not present

## 2023-10-16 DIAGNOSIS — M62838 Other muscle spasm: Secondary | ICD-10-CM | POA: Diagnosis not present

## 2023-10-16 MED ORDER — TRIAMCINOLONE ACETONIDE 40 MG/ML IJ SUSP
60.0000 mg | Freq: Once | INTRAMUSCULAR | Status: AC
Start: 2023-10-16 — End: 2023-10-16
  Administered 2023-10-16: 60 mg via INTRAMUSCULAR

## 2023-10-16 MED ORDER — KETOROLAC TROMETHAMINE 30 MG/ML IJ SOLN
30.0000 mg | Freq: Once | INTRAMUSCULAR | Status: AC
Start: 2023-10-16 — End: 2023-10-16
  Administered 2023-10-16: 30 mg via INTRAMUSCULAR

## 2023-10-16 MED ORDER — METHOCARBAMOL 750 MG PO TABS
750.0000 mg | ORAL_TABLET | Freq: Three times a day (TID) | ORAL | 1 refills | Status: AC | PRN
Start: 2023-10-16 — End: ?

## 2023-10-16 MED ORDER — DICLOFENAC SODIUM 75 MG PO TBEC
75.0000 mg | DELAYED_RELEASE_TABLET | Freq: Two times a day (BID) | ORAL | 0 refills | Status: DC
Start: 2023-10-16 — End: 2023-11-10

## 2023-10-16 NOTE — Progress Notes (Signed)
 I,[name], CMA,acting as a Neurosurgeon for [employer], NP.,have documented all relevant documentation on the behalf of [name], NP,as directed by  [name], NP while in the presence of [name], NP.  Subjective:  Patient ID: Anna Powell , female    DOB: 1970/05/28 , 53 y.o.   MRN: [id number]  Chief Complaint  Patient presents with   Back Pain    Patient presents today for back pain. She would like to have a kenalog  and toradol  shot to help with the pain. Patient reports her back pain started yesterday.     HPI Discussed the use of AI scribe software for clinical note transcription with the patient, who gave verbal consent to proceed.  History of Present Illness     Anna Powell is a 53 year old female with herniated discs who presents with acute back pain.  She began experiencing back pain yesterday while at work, which progressed throughout the evening, causing discomfort and difficulty sleeping due to spasms. She has a history of herniated discs and noticed her spine shifting to the right, which she associates with inflammation.  She is concerned about the potential for sciatica and radiculopathy, conditions she has experienced in the past. This morning, she engaged in light cardio exercises, including riding a stationary bike and using the elliptical, to keep her back loose. However, the pain intensified, prompting her to seek medical attention to prevent further exacerbation.  Currently, she rates her pain as a 3 to 4 out of 10, noting that it is not radiating at this time. The pain is more pronounced when she is sedentary, and movement seems to alleviate it. She has not taken any medication yet but brought Aleve with her to the appointment.  She has previously used muscle relaxants but prefers to avoid those that cause drowsiness, such as Flexeril , due to prolonged effects. She has no history of GERD or acid reflux and is not afraid of  injections.      Past Medical History:  Diagnosis Date   Bacterial vaginosis    Breast density 2008   right breast    Constipation    Gestational thrombocytopenia (HCC)    ITP (idiopathic thrombocytopenic purpura)      Family History  Problem Relation Age of Onset   Hypertension Maternal Grandmother    Cancer Paternal Grandfather        lung   Heart disease Other        maternal great-grandmother heart attack    Hypertension Mother    Non-Hodgkin's lymphoma Father      Current Outpatient Medications:    Adapalene -Benzoyl Peroxide  0.3-2.5 % GEL, Apply 1 application  topically at bedtime., Disp: 60 g, Rfl: 2   Chlorphen-PE-Acetaminophen (NOREL AD) 4-10-325 MG TABS, Take 1 tablet by mouth 2 times per day prn, Disp: 20 tablet, Rfl: 1   clobetasol cream (TEMOVATE) 0.05 %, , Disp: , Rfl:    diclofenac  (VOLTAREN ) 75 MG EC tablet, Take 1 tablet (75 mg total) by mouth 2 (two) times daily., Disp: 30 tablet, Rfl: 0   ferrous sulfate  325 (65 FE) MG EC tablet, TAKE 1 TABLET(325 MG) BY MOUTH DAILY WITH BREAKFAST, Disp: 30 tablet, Rfl: 3   fluticasone  (FLONASE ) 50 MCG/ACT nasal spray, USE 1 SPRAY IN EACH NOSTRIL DAILY, Disp: 16 g, Rfl: 0   HYDROcodone  bit-homatropine (HYDROMET) 5-1.5 MG/5ML syrup, Take 5 mLs by mouth every 6 (six) hours as needed for cough., Disp: 120 mL, Rfl: 0  methocarbamol  (ROBAXIN ) 750 MG tablet, Take 1 tablet (750 mg total) by mouth every 8 (eight) hours as needed for muscle spasms., Disp: 30 tablet, Rfl: 1   montelukast  (SINGULAIR ) 10 MG tablet, TAKE 1 TABLET(10 MG) BY MOUTH DAILY, Disp: 90 tablet, Rfl: 1   tacrolimus  (PROTOPIC ) 0.1 % ointment, Apply topically 2 (two) times daily. Apply to affected areas, Disp: 100 g, Rfl: 3   triamcinolone  (KENALOG ) 0.025 % cream, Apply topically 2 (two) times daily., Disp: 60 g, Rfl: 5   TRULANCE  3 MG TABS, TAKE 1 TABLET(3 MG) BY MOUTH DAILY, Disp: 30 tablet, Rfl: 2   Allergies  Allergen Reactions   Monistat [Miconazole]       Review of Systems  Constitutional: Negative.   HENT: Negative.    Respiratory: Negative.    Cardiovascular: Negative.   Gastrointestinal: Negative.   Musculoskeletal:  Positive for back pain.     Today's Vitals   10/16/23 0923  BP: 110/60  Pulse: 77  TempSrc: Oral  Weight: 160 lb (72.6 kg)  Height: 5' 2 (1.575 m)  PainSc: 3   PainLoc: Back   Body mass index is 29.26 kg/m.  Wt Readings from Last 3 Encounters:  10/16/23 160 lb (72.6 kg)  08/26/23 156 lb (70.8 kg)  12/23/22 146 lb 4.8 oz (66.4 kg)    The 10-year ASCVD risk score (Arnett DK, et al., 2019) is: 1.1%   Values used to calculate the score:     Age: 38 years     Clincally relevant sex: Female     Is Non-Hispanic African American: Yes     Diabetic: No     Tobacco smoker: No     Systolic Blood Pressure: 110 mmHg     Is BP treated: No     HDL Cholesterol: 82 mg/dL     Total Cholesterol: 162 mg/dL  Objective:  Physical Exam HENT:     Head: Normocephalic.  Pulmonary:     Effort: Pulmonary effort is normal.     Breath sounds: Normal breath sounds.  Musculoskeletal:        General: Tenderness present.  Neurological:     Mental Status: She is oriented to person, place, and time.         Assessment And Plan:  Lumbar radicular pain -     Triamcinolone  Acetonide -     Ketorolac  Tromethamine  -     Diclofenac  Sodium; Take 1 tablet (75 mg total) by mouth 2 (two) times daily.  Dispense: 30 tablet; Refill: 0  Muscle spasm -     Methocarbamol ; Take 1 tablet (750 mg total) by mouth every 8 (eight) hours as needed for muscle spasms.  Dispense: 30 tablet; Refill: 1   Assessment and Plan Assessment & Plan Low back pain Acute exacerbation of chronic low back pain due to herniated discs with muscle spasms. Pain rated 3-4/10, non-radiating. Concerned about sciatica and radiculopathy. Pain worsens with sedentary activity, improves with movement. Prefers Robaxin  for lower drowsiness. - Administer 60 mg Kenalog   injection. - Administer Toradol  injection. - Prescribe diclofenac  tablets; instruct to take with food and not to combine with Aleve. - Prescribe Robaxin  as a muscle relaxant to avoid drowsiness associated with other muscle relaxants. - Advise to avoid exercise tomorrow and perform light exercise on Sunday.   Return if symptoms worsen or fail to improve.  Patient was given opportunity to ask questions. Patient verbalized understanding of the plan and was able to repeat key elements of the plan. All questions  were answered to their satisfaction.    I, [name], NP, have reviewed all documentation for this visit. The documentation on 10/21/23 for the exam, diagnosis, procedures, and orders are all accurate and complete.   IF YOU HAVE BEEN REFERRED TO A SPECIALIST, IT MAY TAKE 1-2 WEEKS TO SCHEDULE/PROCESS THE REFERRAL. IF YOU HAVE NOT HEARD FROM US /SPECIALIST IN TWO WEEKS, PLEASE GIVE US  A CALL AT [phone].

## 2023-10-20 DIAGNOSIS — F4323 Adjustment disorder with mixed anxiety and depressed mood: Secondary | ICD-10-CM | POA: Diagnosis not present

## 2023-10-21 ENCOUNTER — Encounter: Payer: Self-pay | Admitting: Family Medicine

## 2023-10-21 DIAGNOSIS — M5416 Radiculopathy, lumbar region: Secondary | ICD-10-CM | POA: Insufficient documentation

## 2023-10-21 DIAGNOSIS — M62838 Other muscle spasm: Secondary | ICD-10-CM | POA: Insufficient documentation

## 2023-10-23 ENCOUNTER — Other Ambulatory Visit: Payer: Self-pay | Admitting: Family Medicine

## 2023-10-23 MED ORDER — BENZONATATE 100 MG PO CAPS: 100.0000 mg | ORAL_CAPSULE | Freq: Three times a day (TID) | ORAL | 0 refills | Status: AC | PRN

## 2023-10-23 MED ORDER — PAXLOVID (150/100) 10 X 150 MG & 10 X 100MG PO TBPK: 2.0000 | ORAL_TABLET | Freq: Two times a day (BID) | ORAL | 0 refills | Status: AC

## 2023-11-03 DIAGNOSIS — F4323 Adjustment disorder with mixed anxiety and depressed mood: Secondary | ICD-10-CM | POA: Diagnosis not present

## 2023-11-09 ENCOUNTER — Other Ambulatory Visit: Payer: Self-pay | Admitting: Family Medicine

## 2023-11-09 DIAGNOSIS — M5416 Radiculopathy, lumbar region: Secondary | ICD-10-CM

## 2023-11-17 DIAGNOSIS — F4323 Adjustment disorder with mixed anxiety and depressed mood: Secondary | ICD-10-CM | POA: Diagnosis not present

## 2023-11-18 DIAGNOSIS — Z01419 Encounter for gynecological examination (general) (routine) without abnormal findings: Secondary | ICD-10-CM | POA: Diagnosis not present

## 2023-12-03 ENCOUNTER — Encounter: Payer: Self-pay | Admitting: Dermatology

## 2023-12-03 ENCOUNTER — Ambulatory Visit: Admitting: Dermatology

## 2023-12-03 DIAGNOSIS — L920 Granuloma annulare: Secondary | ICD-10-CM | POA: Diagnosis not present

## 2023-12-03 DIAGNOSIS — L819 Disorder of pigmentation, unspecified: Secondary | ICD-10-CM

## 2023-12-03 DIAGNOSIS — L700 Acne vulgaris: Secondary | ICD-10-CM

## 2023-12-03 NOTE — Patient Instructions (Addendum)
 Date: Thu Dec 03 2023  Hello M,  Thank you for visiting today. Here is a summary of the key instructions:  - Medications:   - Stop using clobetasol steroid cream  - Skin Care:   - Use La Roche-Posay Urea exfoliating moisturizer with urea on legs and arms   - Continue using adapalene  every other night on face   - Use Vichy Hyaluronic Acid and Mela B5 every morning and night on face   - Apply Avene Cicalfate cream at night after other products  - Follow-up:   - Next appointment in spring to discuss starting prescription tretinoin    Please reach out if you have any questions or concerns.  Warm regards,  Dr. [facility]   Important Information  Due to recent changes in healthcare laws, you may see results of your pathology and/or laboratory studies on MyChart before the doctors have had a chance to review them. We understand that in some cases there may be results that are confusing or concerning to you. Please understand that not all results are received at the same time and often the doctors may need to interpret multiple results in order to provide you with the best plan of care or course of treatment. Therefore, we ask that you please give us  2 business days to thoroughly review all your results before contacting the office for clarification. Should we see a critical lab result, you will be contacted sooner.   If You Need Anything After Your Visit  If you have any questions or concerns for your doctor, please call our main line at [phone] If no one answers, please leave a voicemail as directed and we will return your call as soon as possible. Messages left after 4 pm will be answered the following business day.   You may also send us  a message via MyChart. We typically respond to MyChart messages within 1-2 business days.  For prescription refills, please ask your pharmacy to contact our office. Our fax number is [phone].  If you have an urgent issue  when the clinic is closed that cannot wait until the next business day, you can page your doctor at the number below.    Please note that while we do our best to be available for urgent issues outside of office hours, we are not available 24/7.   If you have an urgent issue and are unable to reach us , you may choose to seek medical care at your doctor's office, retail clinic, urgent care center, or emergency room.  If you have a medical emergency, please immediately call 911 or go to the emergency department. In the event of inclement weather, please call our main line at [phone] for an update on the status of any delays or closures.  Dermatology Medication Tips: Please keep the boxes that topical medications come in in order to help keep track of the instructions about where and how to use these. Pharmacies typically print the medication instructions only on the boxes and not directly on the medication tubes.   If your medication is too expensive, please contact our office at [phone] or send us  a message through MyChart.   We are unable to tell what your co-pay for medications will be in advance as this is different depending on your insurance coverage. However, we may be able to find a substitute medication at lower cost or fill out paperwork to get insurance to cover a needed medication.   If a prior authorization is  required to get your medication covered by your insurance company, please allow us  1-2 business days to complete this process.  Drug prices often vary depending on where the prescription is filled and some pharmacies may offer cheaper prices.  The website www.goodrx.com contains coupons for medications through different pharmacies. The prices here do not account for what the cost may be with help from insurance (it may be cheaper with your insurance), but the website can give you the price if you did not use any insurance.  - You can print the associated coupon and take  it with your prescription to the pharmacy.  - You may also stop by our office during regular business hours and pick up a GoodRx coupon card.  - If you need your prescription sent electronically to a different pharmacy, notify our office through [facility] or by phone at [phone]

## 2023-12-03 NOTE — Progress Notes (Signed)
   Follow-Up Visit   Subjective  Anna Powell is a 53 y.o. female established patient who presents for FOLLOW UP on the diagnoses listed below:  Patient was last evaluated on 05/26/23.   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 She does see improvement in color.   Acne: She is using EpiDuo  every other night as instructed with out experiencing any dryness or peeling. She purchased Vichy and Cicaplast balm that she has been using daily.   Add On: Spot Check of mole on scalp   Are you nursing, pregnant or trying to conceive? No   The following portions of the chart were reviewed this encounter and updated as appropriate: medications, allergies, medical history  Review of Systems:  No other skin or systemic complaints except as noted in HPI or Assessment and Plan.  Objective  Well appearing patient in no apparent distress; mood and affect are within normal limits.   A focused examination was performed of the following areas: B/L lower legs   Relevant exam findings are noted in the Assessment and Plan.     Assessment & Plan    1. 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 Long-term use of topical steroids has resulted in hypopigmentation. Patient reports using clobetasol daily for approximately one year, which explains the development of white spots. - Plan:    Discontinue all topical medications for this condition    Recommend La Roche-Posay moisturizer with urea for gentle exfoliation and hydration    Encourage gradual sun exposure (15-20 minutes, 2-3 days per week) to potentially stimulate melanocyte activity    Patient education provided on the cause of hypopigmentation and the potential  for slow repigmentation  2. Facial Skincare Regimen with Minor Comedonal Component - Assessment: Patient's facial skin appears improved with current regimen. She is using adapalene  every other night, which has helped manage dryness. In the morning, she applies Vichy Hyaluronic Acid and a product containing vitamin B5 for hyperpigmentation. The skin is noted to be smooth and even, with only a minor clogged pore observed. - Plan:    Continue current regimen of adapalene  every other night and morning application of Vichy Hyaluronic Acid and vitamin B5 product    Add Vichy Hyaluronic Acid to nighttime routine    Introduce Avene Cicalfate cream as a nighttime moisturizer to apply after other products to lock in moisture    Can be used on top of adapalene  on treatment nights    Plan to transition to prescription tretinoin  in spring for anti-aging benefits  Follow-up in spring to reassess and potentially start tretinoin .   No follow-ups on file.   Documentation: I have reviewed the above documentation for accuracy and completeness, and I agree with the above.  I, Shirron Maranda, CMA, am acting as scribe for [employer], DO.   Delon Lenis, DO

## 2023-12-07 ENCOUNTER — Encounter: Payer: Self-pay | Admitting: Internal Medicine

## 2023-12-07 ENCOUNTER — Ambulatory Visit: Admitting: Internal Medicine

## 2023-12-07 VITALS — BP 110/82 | HR 77 | Temp 97.6°F | Ht 62.0 in | Wt 160.8 lb

## 2023-12-07 DIAGNOSIS — G8929 Other chronic pain: Secondary | ICD-10-CM | POA: Diagnosis not present

## 2023-12-07 DIAGNOSIS — M545 Low back pain, unspecified: Secondary | ICD-10-CM

## 2023-12-07 DIAGNOSIS — M5442 Lumbago with sciatica, left side: Secondary | ICD-10-CM

## 2023-12-07 MED ORDER — KETOROLAC TROMETHAMINE 60 MG/2ML IM SOLN
60.0000 mg | Freq: Once | INTRAMUSCULAR | Status: AC
Start: 2023-12-07 — End: 2023-12-07
  Administered 2023-12-07: 60 mg via INTRAMUSCULAR

## 2023-12-07 MED ORDER — MELOXICAM 15 MG PO TABS: 15.0000 mg | ORAL_TABLET | ORAL | 1 refills | Status: DC | PRN

## 2023-12-07 NOTE — Patient Instructions (Addendum)
 Chronic Back Pain Chronic back pain is back pain that lasts longer than 3 months. The pain may get worse at certain times (flare-ups). There are things you can do at home to manage your pain. Follow these instructions at home: Watch for any changes in your symptoms. Take these actions to help with your pain: Managing pain and stiffness     If told, put ice on the painful area. You may be told to use ice for 24-48 hours after a flare-up starts. Put ice in a plastic bag. Place a towel between your skin and the bag. Leave the ice on for 20 minutes, 2-3 times a day. If told, put heat on the painful area. Do this as often as told by your doctor. Use the heat source that your doctor recommends, such as a moist heat pack or a heating pad. Place a towel between your skin and the heat source. Leave the heat on for 20-30 minutes. If your skin turns bright red, take off the ice or heat right away to prevent skin damage. The risk of damage is higher if you cannot feel pain, heat, or cold. Soak in a warm bath. This can help with pain. Activity        Avoid bending and other activities that make the pain worse. When you stand: Keep your upper back and neck straight. Keep your shoulders pulled back. Avoid slouching. When you sit: Keep your back straight. Relax your shoulders. Do not round your shoulders or pull them backward. Do not sit or stand in one place for too long. Take short rest breaks during the day. Lying down or standing is often better than sitting. Resting can help relieve pain. When sitting or lying down for a long time, do some mild activity or stretching. This will help to prevent stiffness and pain. Get regular exercise. Ask your doctor what activities are safe for you. You may have to avoid lifting. Ask your provider how much you can safely lift. If you lift things: Bend your knees. Keep the weight close to your body. Avoid twisting. Medicines Take over-the-counter and  prescription medicines only as told by your doctor. You may need to take medicines for pain and swelling. These may be taken by mouth or put on the skin. You may also be given muscle relaxants. Ask your doctor if the medicine prescribed to you: Requires you to avoid driving or using machinery. Can cause trouble pooping (constipation). You may need to take these actions to prevent or treat trouble pooping: Drink enough fluid to keep your pee (urine) pale yellow. Take over-the-counter or prescription medicines. Eat foods that are high in fiber. These include beans, whole grains, and fresh fruits and vegetables. Limit foods that are high in fat and sugars. These include fried or sweet foods. General instructions  Sleep on a firm mattress. Try lying on your side with your knees slightly bent. If you lie on your back, put a pillow under your knees. Do not smoke or use any products that contain nicotine or tobacco. If you need help quitting, ask your doctor. Contact a doctor if: Your pain does not get better with rest or medicine. You have new pain. You have a fever. You lose weight quickly. You have trouble doing your normal activities. One or both of your legs or feet feel weak. One or both of your legs or feet lose feeling (have numbness). Get help right away if: You are not able to control when you pee  or poop. You have bad back pain and: You feel like you may vomit (nauseous). You vomit. You have pain in your chest or your belly (abdomen). You have shortness of breath. You faint. These symptoms may be an emergency. Get help right away. Call 911. Do not wait to see if the symptoms will go away. Do not drive yourself to the hospital. This information is not intended to replace advice given to you by your health care provider. Make sure you discuss any questions you have with your health care provider. Document Revised: 10/21/2021 Document Reviewed: 10/21/2021 Elsevier Patient Education   2024 [employer].

## 2023-12-07 NOTE — Progress Notes (Signed)
 I,Victoria T Emmitt, CMA,acting as a Neurosurgeon for Catheryn LOISE Slocumb, MD.,have documented all relevant documentation on the behalf of Catheryn LOISE Slocumb, MD,as directed by  Catheryn LOISE Slocumb, MD while in the presence of Catheryn LOISE Slocumb, MD.  Subjective:  Patient ID: Anna Powell , female    DOB: 1971/01/29 , 53 y.o.   MRN: [id number]  Chief Complaint  Patient presents with   Back Pain    Patient presents today for lower back pain. This pain started on Thursday. At home she has has used Tylenol Arthritis & Diclofenac  pill. Both have helped temporarily.      HPI Discussed the use of AI scribe software for clinical note transcription with the patient, who gave verbal consent to proceed.  History of Present Illness Anna Powell is a 53 year old female who presents with back pain.  She has been experiencing back pain since Thursday, which has progressively worsened by Saturday. The pain is described as a constant dull, throbbing sensation without sharp shooting pain, radiating down to her left toe. She has a history of similar back issues, but this episode feels like her 'normal back stuff.'  She manages the pain with Tylenol Arthritis and previously prescribed diclofenac , which she feels is less effective than meloxicam . She typically takes meloxicam  at night due to its sedative effects. She has not used the muscle relaxer prescribed to her, as she reserves it for when she experiences spasms, which she has not had recently.  She rates her current pain as a 4 out of 10, noting that it fluctuates and worsens with prolonged sitting. She tries to stay active to prevent stiffness. She has noticed a slight alteration in her gait and feels the need to move cautiously to avoid sudden pain.    Past Medical History:  Diagnosis Date   Bacterial vaginosis    Breast density 2008   right breast    Constipation    Gestational thrombocytopenia    ITP (idiopathic thrombocytopenic purpura)       Family History  Problem Relation Age of Onset   Hypertension Maternal Grandmother    Cancer Paternal Grandfather        lung   Heart disease Other        maternal great-grandmother heart attack    Hypertension Mother    Non-Hodgkin's lymphoma Father      Current Outpatient Medications:    Adapalene -Benzoyl Peroxide  0.3-2.5 % GEL, Apply 1 application  topically at bedtime., Disp: 60 g, Rfl: 2   benzonatate  (TESSALON  PERLES) 100 MG capsule, Take 1 capsule (100 mg total) by mouth 3 (three) times daily as needed., Disp: 30 capsule, Rfl: 0   Chlorphen-PE-Acetaminophen (NOREL AD) 4-10-325 MG TABS, Take 1 tablet by mouth 2 times per day prn, Disp: 20 tablet, Rfl: 1   clobetasol cream (TEMOVATE) 0.05 %, , Disp: , Rfl:    ferrous sulfate  325 (65 FE) MG EC tablet, TAKE 1 TABLET(325 MG) BY MOUTH DAILY WITH BREAKFAST, Disp: 30 tablet, Rfl: 3   fluticasone  (FLONASE ) 50 MCG/ACT nasal spray, USE 1 SPRAY IN EACH NOSTRIL DAILY, Disp: 16 g, Rfl: 0   HYDROcodone  bit-homatropine (HYDROMET) 5-1.5 MG/5ML syrup, Take 5 mLs by mouth every 6 (six) hours as needed for cough., Disp: 120 mL, Rfl: 0   methocarbamol  (ROBAXIN ) 750 MG tablet, Take 1 tablet (750 mg total) by mouth every 8 (eight) hours as needed for muscle spasms., Disp: 30 tablet, Rfl: 1   montelukast  (SINGULAIR ) 10 MG tablet,  TAKE 1 TABLET(10 MG) BY MOUTH DAILY, Disp: 90 tablet, Rfl: 1   tacrolimus  (PROTOPIC ) 0.1 % ointment, Apply topically 2 (two) times daily. Apply to affected areas, Disp: 100 g, Rfl: 3   triamcinolone  (KENALOG ) 0.025 % cream, Apply topically 2 (two) times daily., Disp: 60 g, Rfl: 5   TRULANCE  3 MG TABS, TAKE 1 TABLET(3 MG) BY MOUTH DAILY, Disp: 30 tablet, Rfl: 2   meloxicam  (MOBIC ) 15 MG tablet, Take 1 tablet (15 mg total) by mouth daily as needed., Disp: 30 tablet, Rfl: 1   Allergies  Allergen Reactions   Monistat [Miconazole]      Review of Systems  Constitutional: Negative.   Respiratory: Negative.    Cardiovascular:  Negative.   Musculoskeletal:  Positive for back pain.  Neurological: Negative.   Psychiatric/Behavioral: Negative.       Today's Vitals   12/07/23 0951  BP: 110/82  Pulse: 77  Temp: 97.6 F (36.4 C)  SpO2: 98%  Weight: 160 lb 12.8 oz (72.9 kg)  Height: 5' 2 (1.575 m)   Body mass index is 29.41 kg/m.  Wt Readings from Last 3 Encounters:  12/07/23 160 lb 12.8 oz (72.9 kg)  10/16/23 160 lb (72.6 kg)  08/26/23 156 lb (70.8 kg)     Objective:  Physical Exam Vitals and nursing note reviewed.  Constitutional:      Appearance: Normal appearance.  HENT:     Head: Normocephalic and atraumatic.  Eyes:     Extraocular Movements: Extraocular movements intact.  Cardiovascular:     Rate and Rhythm: Normal rate and regular rhythm.     Heart sounds: Normal heart sounds.  Pulmonary:     Effort: Pulmonary effort is normal.     Breath sounds: Normal breath sounds.  Musculoskeletal:        General: Tenderness present.  Skin:    General: Skin is warm.  Neurological:     General: No focal deficit present.     Mental Status: She is alert.  Psychiatric:        Mood and Affect: Mood normal.        Behavior: Behavior normal.         Assessment And Plan:  Chronic midline low back pain with left-sided sciatica Assessment & Plan: Chronic low back pain with recent exacerbation and left-sided sciatica. Previous diclofenac  ineffective. Meloxicam  preferred despite drowsiness. Robaxin  reserved for spasms. - Prescribed meloxicam  15 mg, start tomorrow. - Advised Robaxin  as needed for spasms. - Recommended Tylenol Arthritis as needed, avoid Motrin with meloxicam  - Instructed on figure four stretch, perform twice daily - Provided pain patches - Sent meloxicam  prescription to [facility].  Orders: -     Ketorolac  Tromethamine         Return if symptoms worsen or fail to improve.  Patient was given opportunity to ask questions. Patient verbalized understanding of the plan and was able  to repeat key elements of the plan. All questions were answered to their satisfaction.   I, Catheryn LOISE Slocumb, MD, have reviewed all documentation for this visit. The documentation on 12/07/23 for the exam, diagnosis, procedures, and orders are all accurate and complete.   IF YOU HAVE BEEN REFERRED TO A SPECIALIST, IT MAY TAKE 1-2 WEEKS TO SCHEDULE/PROCESS THE REFERRAL. IF YOU HAVE NOT HEARD FROM US /SPECIALIST IN TWO WEEKS, PLEASE GIVE US  A CALL AT [phone].   THE PATIENT IS ENCOURAGED TO PRACTICE SOCIAL DISTANCING DUE TO THE COVID-19 PANDEMIC.

## 2023-12-08 ENCOUNTER — Other Ambulatory Visit: Payer: Self-pay

## 2023-12-08 MED ORDER — MELOXICAM 15 MG PO TABS: 15.0000 mg | ORAL_TABLET | Freq: Every day | ORAL | 1 refills | Status: DC | PRN

## 2023-12-12 DIAGNOSIS — G8929 Other chronic pain: Secondary | ICD-10-CM | POA: Insufficient documentation

## 2023-12-12 NOTE — Assessment & Plan Note (Signed)
 Chronic low back pain with recent exacerbation and left-sided sciatica. Previous diclofenac  ineffective. Meloxicam  preferred despite drowsiness. Robaxin  reserved for spasms. - Prescribed meloxicam  15 mg, start tomorrow. - Advised Robaxin  as needed for spasms. - Recommended Tylenol Arthritis as needed, avoid Motrin with meloxicam  - Instructed on figure four stretch, perform twice daily - Provided pain patches - Sent meloxicam  prescription to [facility].

## 2023-12-14 NOTE — Telephone Encounter (Signed)
 error

## 2023-12-17 ENCOUNTER — Encounter: Payer: Self-pay | Admitting: Internal Medicine

## 2023-12-18 ENCOUNTER — Ambulatory Visit (INDEPENDENT_AMBULATORY_CARE_PROVIDER_SITE_OTHER)

## 2023-12-18 VITALS — BP 120/70 | HR 89 | Temp 98.5°F | Ht 62.0 in | Wt 160.0 lb

## 2023-12-18 DIAGNOSIS — M545 Low back pain, unspecified: Secondary | ICD-10-CM

## 2023-12-18 MED ORDER — TRIAMCINOLONE ACETONIDE 40 MG/ML IJ SUSP
60.0000 mg | Freq: Once | INTRAMUSCULAR | Status: AC
Start: 2023-12-18 — End: 2023-12-18
  Administered 2023-12-18: 60 mg via INTRAMUSCULAR

## 2023-12-18 NOTE — Progress Notes (Signed)
 Patient is in office today for a nurse visit for kenalog  shot. Patient Injection was given in the  Right upper quad. gluteus. Patient tolerated injection well.

## 2023-12-21 ENCOUNTER — Other Ambulatory Visit: Payer: Self-pay | Admitting: Internal Medicine

## 2023-12-21 DIAGNOSIS — M545 Low back pain, unspecified: Secondary | ICD-10-CM

## 2023-12-23 ENCOUNTER — Encounter: Payer: Self-pay | Admitting: Internal Medicine

## 2023-12-23 ENCOUNTER — Ambulatory Visit (INDEPENDENT_AMBULATORY_CARE_PROVIDER_SITE_OTHER): Payer: Federal, State, Local not specified - PPO | Admitting: Internal Medicine

## 2023-12-23 VITALS — BP 118/78 | HR 80 | Temp 98.1°F | Ht 62.0 in | Wt 160.0 lb

## 2023-12-23 DIAGNOSIS — Z0001 Encounter for general adult medical examination with abnormal findings: Secondary | ICD-10-CM

## 2023-12-23 DIAGNOSIS — Z8249 Family history of ischemic heart disease and other diseases of the circulatory system: Secondary | ICD-10-CM | POA: Diagnosis not present

## 2023-12-23 DIAGNOSIS — K5909 Other constipation: Secondary | ICD-10-CM

## 2023-12-23 DIAGNOSIS — H6121 Impacted cerumen, right ear: Secondary | ICD-10-CM

## 2023-12-23 DIAGNOSIS — Z0000 Encounter for general adult medical examination without abnormal findings: Secondary | ICD-10-CM

## 2023-12-23 MED ORDER — FLUTICASONE PROPIONATE 50 MCG/ACT NA SUSP: 1.0000 | Freq: Every day | NASAL | 0 refills | Status: DC

## 2023-12-23 MED ORDER — MONTELUKAST SODIUM 10 MG PO TABS: 10.0000 mg | ORAL_TABLET | Freq: Every day | ORAL | 1 refills | Status: AC

## 2023-12-23 NOTE — Assessment & Plan Note (Signed)
AFTER OBTAINING VERBAL CONSENT, RIGHT EAR WAS FLUSHED BY IRRIGATION. SHE TOLERATED PROCEDURE WELL WITHOUT ANY COMPLICATIONS. NO TM ABNORMALITIES WERE NOTED.

## 2023-12-23 NOTE — Assessment & Plan Note (Signed)
 Discussed lipoprotein A test for genetic predisposition to heart disease. Explained potential follow-up with cardiac calcium score if results are elevated. Noted insurance does not cover cardiac calcium score, costing $99.

## 2023-12-23 NOTE — Assessment & Plan Note (Signed)
 Chronic, sx are stable with use of Trulance . No new concerns.

## 2023-12-23 NOTE — Assessment & Plan Note (Signed)

## 2023-12-23 NOTE — Patient Instructions (Signed)

## 2023-12-23 NOTE — Progress Notes (Signed)
 I,Victoria T Emmitt, CMA,acting as a Neurosurgeon for Catheryn LOISE Slocumb, MD.,have documented all relevant documentation on the behalf of Catheryn LOISE Slocumb, MD,as directed by  Catheryn LOISE Slocumb, MD while in the presence of Catheryn LOISE Slocumb, MD.  Subjective:    Patient ID: Anna Powell , female    DOB: [birth year] , 53 y.o.   MRN: [id number]  Chief Complaint  Patient presents with   Annual Exam    Patient here for HM. She is followed by Dr.Dillard for her GYN care. She has no specific complaints/concerns at this time.     HPI Discussed the use of AI scribe software for clinical note transcription with the patient, who gave verbal consent to proceed.  History of Present Illness Anna Powell is a 53 year old female who presents for a routine follow-up visit.  She is postmenopausal and exercises daily, approximately six to seven days a week. Her diet excludes red meat and pork.  Her bowel movements are regular with the aid of Trulance , which she finds effective.  Her family history is significant for heart disease. One brother had heart failure and hypertension, while another had an enlarged heart and hypertension. Both brothers experienced strokes, and one had a history of being shot, which may have contributed to his health issues.  She had a colonoscopy in 2021, her last Pap smear in 2023, and a mammogram in July. She is up to date with her shingles vaccination.      Past Medical History:  Diagnosis Date   Anemia    Bacterial vaginosis    Breast density 03/17/2006   right breast    Constipation    Gestational thrombocytopenia    ITP (idiopathic thrombocytopenic purpura)      Family History  Problem Relation Age of Onset   Hypertension Maternal Grandmother    Cancer Paternal Grandfather        lung   Heart disease Other        maternal great-grandmother heart attack    Hypertension Mother    Non-Hodgkin's lymphoma Father    Cancer Father    COPD Maternal Grandfather     Heart disease Brother    Hypertension Brother    Stroke Brother    Hypertension Brother    Stroke Brother      Current Outpatient Medications:    Adapalene -Benzoyl Peroxide  0.3-2.5 % GEL, Apply 1 application  topically at bedtime., Disp: 60 g, Rfl: 2   benzonatate  (TESSALON  PERLES) 100 MG capsule, Take 1 capsule (100 mg total) by mouth 3 (three) times daily as needed., Disp: 30 capsule, Rfl: 0   Chlorphen-PE-Acetaminophen (NOREL AD) 4-10-325 MG TABS, Take 1 tablet by mouth 2 times per day prn, Disp: 20 tablet, Rfl: 1   clobetasol cream (TEMOVATE) 0.05 %, , Disp: , Rfl:    ferrous sulfate  325 (65 FE) MG EC tablet, TAKE 1 TABLET(325 MG) BY MOUTH DAILY WITH BREAKFAST, Disp: 30 tablet, Rfl: 3   HYDROcodone  bit-homatropine (HYDROMET) 5-1.5 MG/5ML syrup, Take 5 mLs by mouth every 6 (six) hours as needed for cough., Disp: 120 mL, Rfl: 0   meloxicam  (MOBIC ) 15 MG tablet, Take 1 tablet (15 mg total) by mouth daily as needed., Disp: 30 tablet, Rfl: 1   methocarbamol  (ROBAXIN ) 750 MG tablet, Take 1 tablet (750 mg total) by mouth every 8 (eight) hours as needed for muscle spasms., Disp: 30 tablet, Rfl: 1   tacrolimus  (PROTOPIC ) 0.1 % ointment, Apply topically 2 (two) times daily. Apply  to affected areas, Disp: 100 g, Rfl: 3   triamcinolone  (KENALOG ) 0.025 % cream, Apply topically 2 (two) times daily., Disp: 60 g, Rfl: 5   TRULANCE  3 MG TABS, TAKE 1 TABLET(3 MG) BY MOUTH DAILY, Disp: 30 tablet, Rfl: 2   fluticasone  (FLONASE ) 50 MCG/ACT nasal spray, Place 1 spray into both nostrils daily., Disp: 16 g, Rfl: 0   montelukast  (SINGULAIR ) 10 MG tablet, Take 1 tablet (10 mg total) by mouth at bedtime. TAKE 1 TABLET(10 MG) BY MOUTH DAILY, Disp: 90 tablet, Rfl: 1   Allergies  Allergen Reactions   Monistat [Miconazole]       The patient states she uses post menopausal status for birth control. Patient's last menstrual period was 05/28/2020.. Negative for Dysmenorrhea. Negative for: breast discharge, breast  lump(s), breast pain and breast self exam. Associated symptoms include abnormal vaginal bleeding. Pertinent negatives include abnormal bleeding (hematology), anxiety, decreased libido, depression, difficulty falling sleep, dyspareunia, history of infertility, nocturia, sexual dysfunction, sleep disturbances, urinary incontinence, urinary urgency, vaginal discharge and vaginal itching. Diet regular.The patient states her exercise level is    . The patient's tobacco use is:  Social History   Tobacco Use  Smoking Status Never   Passive exposure: Never  Smokeless Tobacco Never  . She has been exposed to passive smoke. The patient's alcohol use is:  Social History   Substance and Sexual Activity  Alcohol Use No    Review of Systems  Constitutional: Negative.   HENT: Negative.    Eyes: Negative.   Respiratory: Negative.    Cardiovascular: Negative.   Gastrointestinal: Negative.   Endocrine: Negative.   Genitourinary: Negative.   Musculoskeletal: Negative.   Skin: Negative.   Allergic/Immunologic: Negative.   Neurological: Negative.   Hematological: Negative.   Psychiatric/Behavioral: Negative.       Today's Vitals   12/23/23 0902  BP: 118/78  Pulse: 80  Temp: 98.1 F (36.7 C)  SpO2: 98%  Weight: 160 lb (72.6 kg)  Height: 5' 2 (1.575 m)   Body mass index is 29.26 kg/m.  Wt Readings from Last 3 Encounters:  12/23/23 160 lb (72.6 kg)  12/18/23 160 lb (72.6 kg)  12/07/23 160 lb 12.8 oz (72.9 kg)     Objective:  Physical Exam Vitals and nursing note reviewed.  Constitutional:      Appearance: Normal appearance.  HENT:     Head: Normocephalic and atraumatic.     Right Ear: Ear canal and external ear normal. There is impacted cerumen.     Left Ear: Tympanic membrane, ear canal and external ear normal.     Nose: Nose normal.     Mouth/Throat:     Mouth: Mucous membranes are moist.     Pharynx: Oropharynx is clear.  Eyes:     Extraocular Movements: Extraocular  movements intact.     Conjunctiva/sclera: Conjunctivae normal.     Pupils: Pupils are equal, round, and reactive to light.  Cardiovascular:     Rate and Rhythm: Normal rate and regular rhythm.     Pulses: Normal pulses.     Heart sounds: Normal heart sounds.  Pulmonary:     Effort: Pulmonary effort is normal.     Breath sounds: Normal breath sounds.  Chest:  Breasts:    Tanner Score is 5.     Right: Normal.     Left: Normal.  Abdominal:     General: Abdomen is flat. Bowel sounds are normal.     Palpations: Abdomen is soft.  Genitourinary:    Comments: deferred Musculoskeletal:        General: Normal range of motion.     Cervical back: Normal range of motion and neck supple.  Skin:    General: Skin is warm and dry.     Comments: Scattered areas of hypo- and hyperpigmentation  Neurological:     General: No focal deficit present.     Mental Status: She is alert and oriented to person, place, and time.  Psychiatric:        Mood and Affect: Mood normal.        Behavior: Behavior normal.         Assessment And Plan:     Encounter for general adult medical examination w/o abnormal findings Assessment & Plan: A full exam was performed.  Importance of monthly self breast exams was discussed with the patient.  She is advised to get 30-45 minutes of regular exercise, no less than four to five days per week. Both weight-bearing and aerobic exercises are recommended.  She is advised to follow a healthy diet with at least six fruits/veggies per day, decrease intake of red meat and other saturated fats and to increase fish intake to twice weekly.  Meats/fish should not be fried -- baked, boiled or broiled is preferable. It is also important to cut back on your sugar intake.  Be sure to read labels - try to avoid anything with added sugar, high fructose corn syrup or other sweeteners.  If you must use a sweetener, you can try stevia or monkfruit.  It is also important to avoid artificially  sweetened foods/beverages and diet drinks. Lastly, wear SPF 50 sunscreen on exposed skin and when in direct sunlight for an extended period of time.  Be sure to avoid fast food restaurants and aim for at least 60 ounces of water daily.      Orders: -     CBC -     CMP14+EGFR -     Lipid panel  Chronic constipation Assessment & Plan: Chronic, sx are stable with use of Trulance . No new concerns.    Impacted cerumen of right ear Assessment & Plan: AFTER OBTAINING VERBAL CONSENT, RIGHT EAR WAS FLUSHED BY IRRIGATION. SHE TOLERATED PROCEDURE WELL WITHOUT ANY COMPLICATIONS. NO TM ABNORMALITIES WERE NOTED.   Orders: -     Ear Lavage  Family history of heart disease Assessment & Plan: Discussed lipoprotein A test for genetic predisposition to heart disease. Explained potential follow-up with cardiac calcium score if results are elevated. Noted insurance does not cover cardiac calcium score, costing $99.  Orders: -     Lipoprotein A (LPA)  Other orders -     Fluticasone  Propionate; Place 1 spray into both nostrils daily.  Dispense: 16 g; Refill: 0 -     Montelukast  Sodium; Take 1 tablet (10 mg total) by mouth at bedtime. TAKE 1 TABLET(10 MG) BY MOUTH DAILY  Dispense: 90 tablet; Refill: 1    Return for 1 year HM . Patient was given opportunity to ask questions. Patient verbalized understanding of the plan and was able to repeat key elements of the plan. All questions were answered to their satisfaction.   I, Catheryn LOISE Slocumb, MD, have reviewed all documentation for this visit. The documentation on 12/23/23 for the exam, diagnosis, procedures, and orders are all accurate and complete.

## 2023-12-24 ENCOUNTER — Ambulatory Visit: Payer: Self-pay | Admitting: Internal Medicine

## 2023-12-24 ENCOUNTER — Encounter: Payer: Federal, State, Local not specified - PPO | Admitting: Internal Medicine

## 2023-12-24 LAB — CMP14+EGFR
ALT: 22 IU/L (ref 0–32)
AST: 22 IU/L (ref 0–40)
Albumin: 4.2 g/dL (ref 3.8–4.9)
Alkaline Phosphatase: 75 IU/L (ref 49–135)
BUN/Creatinine Ratio: 10 (ref 9–23)
BUN: 11 mg/dL (ref 6–24)
Bilirubin Total: 0.5 mg/dL (ref 0.0–1.2)
CO2: 24 mmol/L (ref 20–29)
Calcium: 9.7 mg/dL (ref 8.7–10.2)
Chloride: 102 mmol/L (ref 96–106)
Creatinine, Ser: 1.09 mg/dL — ABNORMAL HIGH (ref 0.57–1.00)
Globulin, Total: 2.3 g/dL (ref 1.5–4.5)
Glucose: 81 mg/dL (ref 70–99)
Potassium: 4.4 mmol/L (ref 3.5–5.2)
Sodium: 140 mmol/L (ref 134–144)
Total Protein: 6.5 g/dL (ref 6.0–8.5)
eGFR: 60 mL/min/1.73 (ref >59)

## 2023-12-24 LAB — CBC
Hematocrit: 33.7 % — ABNORMAL LOW (ref 34.0–46.6)
Hemoglobin: 10.7 g/dL — ABNORMAL LOW (ref 11.1–15.9)
MCH: 29 pg (ref 26.6–33.0)
MCHC: 31.8 g/dL (ref 31.5–35.7)
MCV: 91 fL (ref 79–97)
RBC: 3.69 x10E6/uL — ABNORMAL LOW (ref 3.77–5.28)
RDW: 13 % (ref 11.7–15.4)
WBC: 5.5 x10E3/uL (ref 3.4–10.8)

## 2023-12-24 LAB — LIPID PANEL
Chol/HDL Ratio: 2.1 ratio (ref 0.0–4.4)
Cholesterol, Total: 159 mg/dL (ref 100–199)
HDL: 77 mg/dL (ref >39)
LDL Chol Calc (NIH): 74 mg/dL (ref 0–99)
Triglycerides: 34 mg/dL (ref 0–149)
VLDL Cholesterol Cal: 8 mg/dL (ref 5–40)

## 2023-12-24 LAB — LIPOPROTEIN A (LPA): Lipoprotein (a): 29.4 nmol/L (ref <75.0)

## 2023-12-29 DIAGNOSIS — F4323 Adjustment disorder with mixed anxiety and depressed mood: Secondary | ICD-10-CM | POA: Diagnosis not present

## 2023-12-29 LAB — ANEMIA PROFILE B
Ferritin: 36 ng/mL (ref 15–150)
Folate: 7.1 ng/mL (ref >3.0)
Iron Saturation: 30 % (ref 15–55)
Iron: 89 ug/dL (ref 27–159)
Total Iron Binding Capacity: 300 ug/dL (ref 250–450)
UIBC: 211 ug/dL (ref 131–425)
Vitamin B-12: 707 pg/mL (ref 232–1245)

## 2023-12-29 LAB — SPECIMEN STATUS REPORT

## 2023-12-30 ENCOUNTER — Ambulatory Visit (INDEPENDENT_AMBULATORY_CARE_PROVIDER_SITE_OTHER): Admitting: Physical Medicine and Rehabilitation

## 2023-12-30 ENCOUNTER — Other Ambulatory Visit: Payer: Self-pay

## 2023-12-30 ENCOUNTER — Encounter: Payer: Self-pay | Admitting: Physical Medicine and Rehabilitation

## 2023-12-30 DIAGNOSIS — M5416 Radiculopathy, lumbar region: Secondary | ICD-10-CM

## 2023-12-30 DIAGNOSIS — M5442 Lumbago with sciatica, left side: Secondary | ICD-10-CM

## 2023-12-30 DIAGNOSIS — G8929 Other chronic pain: Secondary | ICD-10-CM

## 2023-12-30 NOTE — Progress Notes (Signed)
 Anna Powell - 53 y.o. female MRN [id number]  Date of birth: 1970/11/09  Office Visit Note: Visit Date: 12/30/2023 PCP: Jarold Medici, MD Referred by: Jarold Medici, MD  Subjective: Chief Complaint  Patient presents with   Lower Back - Pain   HPI: Anna Powell is a 53 y.o. female who comes in today Per the request of Dr. Medici Jarold for evaluation of chronic, worsening and severe bilateral lower back pain radiating to left buttock, hip and down lateral leg to great toe. Pain ongoing for several years, worsens with prolonged sitting. Also reports pain with household activities such as cleaning. Feels her pain is worse during cold weather. She describes her pain as spasms, sharp and sore sensation, currently rates as 2 out of 10. Some relief of pain with home exercise regimen, rest and use of medications. She is currently taking meloxicam  with good relief of pain. History of formal physical therapy with some relief of pain. She is fairly active, exercises in the gym daily. No recent imaging of lumbar spine. She was previously managed at [facility], no history of lumbar surgery/injections. Patient denies focal weakness, numbness and tingling. No recent trauma or falls.      Review of Systems  Musculoskeletal:  Positive for back pain.  Neurological:  Negative for tingling, sensory change, focal weakness and weakness.  All other systems reviewed and are negative.  Otherwise per HPI.  Assessment & Plan: Visit Diagnoses:    ICD-10-CM   1. Chronic bilateral low back pain with left-sided sciatica  G89.29 XR Lumbar Spine 2-3 Views   M54.42 MR LUMBAR SPINE WO CONTRAST    2. Lumbar radiculopathy  M54.16 XR Lumbar Spine 2-3 Views    MR LUMBAR SPINE WO CONTRAST       Plan: Findings:  Chronic, worsening and severe bilateral lower back pain radiating to left buttock, hip and down lateral leg to great toe. Patient continues to have pain despite good conservative  therapies such as formal physical therapy, home exercise regimen, rest and use of medications. Patients clinical presentation and exam are consistent with lumbar radiculopathy, more of L5 distribution. I obtained lumbar radiographs in the office today that show normal alignment and segmentation, there are multi level degenerative changes, disc height loss and biforaminal narrowing at L4-L5 and L5-S1. We discussed treatment plan in detail today. Next step is to place order for lumbar MRI imaging. Depending on results of MRI imaging we discussed possibility of performing lumbar epidural steroid injection. I encouraged her to continue with home exercise regimen and resistance training as tolerated. She can also continue with meloxicam , I am happy to refill short course. She has no questions at this time. No red flag symptoms noted upon exam today.     Meds & Orders: No orders of the defined types were placed in this encounter.   Orders Placed This Encounter  Procedures   XR Lumbar Spine 2-3 Views   MR LUMBAR SPINE WO CONTRAST    Follow-up: Return for Lumbar MRI review.   Procedures: No procedures performed      Clinical History: No specialty comments available.   She reports that she has never smoked. She has never been exposed to tobacco smoke. She has never used smokeless tobacco. No results for input(s): HGBA1C, LABURIC in the last 8760 hours.  Objective:  VS:  HT:    WT:   BMI:     BP:   HR: bpm  TEMP: ( )  RESP:  Physical Exam Vitals and nursing note reviewed.  HENT:     Head: Normocephalic and atraumatic.     Right Ear: External ear normal.     Left Ear: External ear normal.     Nose: Nose normal.     Mouth/Throat:     Mouth: Mucous membranes are moist.  Eyes:     Extraocular Movements: Extraocular movements intact.  Cardiovascular:     Rate and Rhythm: Normal rate.     Pulses: Normal pulses.  Pulmonary:     Effort: Pulmonary effort is normal.  Abdominal:      General: Abdomen is flat. There is no distension.  Musculoskeletal:        General: Tenderness present.     Cervical back: Normal range of motion.     Comments: Patient rises from seated position to standing without difficulty. Good lumbar range of motion. No pain noted with facet loading. 5/5 strength noted with bilateral hip flexion, knee flexion/extension, ankle dorsiflexion/plantarflexion and EHL. No clonus noted bilaterally. No pain upon palpation of greater trochanters. No pain with internal/external rotation of bilateral hips. Sensation intact bilaterally. Negative slump test bilaterally. Dysesthesias noted to left L5 dermatome. Ambulates without aid, gait steady.     Skin:    General: Skin is warm and dry.     Capillary Refill: Capillary refill takes less than 2 seconds.  Neurological:     General: No focal deficit present.     Mental Status: She is alert and oriented to person, place, and time.  Psychiatric:        Mood and Affect: Mood normal.        Behavior: Behavior normal.     Ortho Exam  Imaging: No results found.  Past Medical/Family/Surgical/Social History: Medications & Allergies reviewed per EMR, new medications updated. Patient Active Problem List   Diagnosis Date Noted   Chronic constipation 12/23/2023   Family history of heart disease 12/23/2023   Chronic midline low back pain with left-sided sciatica 12/12/2023   Lumbar radicular pain 10/21/2023   Muscle spasm 10/21/2023   UTI (urinary tract infection), bacterial 08/26/2023   Dysuria 08/26/2023   Lightheadedness 05/02/2023   Paresthesia of left leg 05/02/2023   Encounter for general adult medical examination w/o abnormal findings 12/23/2022   History of anemia 12/23/2022   Impacted cerumen of right ear 12/23/2022   Rash and nonspecific skin eruption 04/09/2021   Pruritus 04/09/2021   Thrombocytopenia 01/11/2021   Iron deficiency anemia 01/11/2021   Acute low back pain without sciatica 06/10/2018    Past Medical History:  Diagnosis Date   Anemia    Bacterial vaginosis    Breast density 03/17/2006   right breast    Constipation    Gestational thrombocytopenia    ITP (idiopathic thrombocytopenic purpura)    Family History  Problem Relation Age of Onset   Hypertension Maternal Grandmother    Cancer Paternal Grandfather        lung   Heart disease Other        maternal great-grandmother heart attack    Hypertension Mother    Non-Hodgkin's lymphoma Father    Cancer Father    COPD Maternal Grandfather    Heart disease Brother    Hypertension Brother    Stroke Brother    Hypertension Brother    Stroke Brother    Past Surgical History:  Procedure Laterality Date   CESAREAN SECTION  02/18/2001   Dr. Armond   ENDOMETRIAL BIOPSY  11/01/2009   TUBAL  LIGATION  02/18/2001   Dillard   Social History   Occupational History   Not on file  Tobacco Use   Smoking status: Never    Passive exposure: Never   Smokeless tobacco: Never  Vaping Use   Vaping status: Never Used  Substance and Sexual Activity   Alcohol use: No   Drug use: No   Sexual activity: Yes    Birth control/protection: Surgical    Comment: BTL

## 2023-12-30 NOTE — Progress Notes (Signed)
 Pain Scale   Average Pain 2 Patient advising she has acute lower back pain radiating to her left Leg and foot, also advising it radiates to her hips.        +Driver, -BT, -Dye Allergies.

## 2024-01-01 ENCOUNTER — Ambulatory Visit (INDEPENDENT_AMBULATORY_CARE_PROVIDER_SITE_OTHER)

## 2024-01-01 VITALS — BP 120/80 | HR 90 | Temp 98.1°F | Ht 62.0 in | Wt 160.0 lb

## 2024-01-01 DIAGNOSIS — Z23 Encounter for immunization: Secondary | ICD-10-CM | POA: Diagnosis not present

## 2024-01-01 NOTE — Patient Instructions (Signed)

## 2024-01-01 NOTE — Progress Notes (Signed)
 Patient is in office today for a nurse visit for flu Immunization. Patient Injection was given in the  Left deltoid. Patient tolerated injection well.

## 2024-01-11 ENCOUNTER — Ambulatory Visit
Admission: RE | Admit: 2024-01-11 | Discharge: 2024-01-11 | Disposition: A | Source: Ambulatory Visit | Attending: Physical Medicine and Rehabilitation | Admitting: Physical Medicine and Rehabilitation

## 2024-01-11 DIAGNOSIS — M5126 Other intervertebral disc displacement, lumbar region: Secondary | ICD-10-CM | POA: Diagnosis not present

## 2024-01-11 DIAGNOSIS — M5416 Radiculopathy, lumbar region: Secondary | ICD-10-CM

## 2024-01-11 DIAGNOSIS — M47817 Spondylosis without myelopathy or radiculopathy, lumbosacral region: Secondary | ICD-10-CM | POA: Diagnosis not present

## 2024-01-11 DIAGNOSIS — M48061 Spinal stenosis, lumbar region without neurogenic claudication: Secondary | ICD-10-CM | POA: Diagnosis not present

## 2024-01-11 DIAGNOSIS — G8929 Other chronic pain: Secondary | ICD-10-CM

## 2024-01-13 ENCOUNTER — Ambulatory Visit: Payer: Self-pay

## 2024-01-13 NOTE — Telephone Encounter (Signed)
 [id number] to schedule MRI review with Megan

## 2024-01-13 NOTE — Telephone Encounter (Signed)
-----   Message from Hendricks, [place] E sent at 01/13/2024 10:19 AM EDT ----- OV for lumbar MRI review.  ----- Message ----- From: Interface, Rad Results In Sent: 01/13/2024  10:18 AM EDT To: Duwaine FORBES Pouch, NP

## 2024-01-18 ENCOUNTER — Encounter: Payer: Self-pay | Admitting: Radiology

## 2024-01-19 ENCOUNTER — Other Ambulatory Visit

## 2024-01-20 ENCOUNTER — Encounter: Payer: Self-pay | Admitting: Physical Medicine and Rehabilitation

## 2024-01-20 ENCOUNTER — Ambulatory Visit: Admitting: Physical Medicine and Rehabilitation

## 2024-01-20 DIAGNOSIS — G8929 Other chronic pain: Secondary | ICD-10-CM

## 2024-01-20 DIAGNOSIS — M5116 Intervertebral disc disorders with radiculopathy, lumbar region: Secondary | ICD-10-CM | POA: Diagnosis not present

## 2024-01-20 DIAGNOSIS — M5416 Radiculopathy, lumbar region: Secondary | ICD-10-CM

## 2024-01-20 NOTE — Progress Notes (Signed)
 Anna Powell - 53 y.o. female MRN [id number]  Date of birth: [birth year]  Office Visit Note: Visit Date: 01/20/2024 PCP: Jarold Medici, MD Referred by: Jarold Medici, MD  Subjective: Chief Complaint  Patient presents with   Lower Back - Pain   HPI: Anna Powell is a 53 y.o. female who comes in today for evaluation of chronic bilateral lower back pain radiating to hips, left buttock, and down lateral leg to great toe. Pain ongoing for several years, worsens with prolonged sitting, prolonged walking and lifting heavy objects. She describes pain as sharp sensation, currently rates as 1 out of 10. Some relief of pain with home exercise regimen, rest and use of medications. She is currently taking meloxicam  with good relief of pain. History of formal physical therapy with some relief of pain. She is fairly active, exercises in the gym daily. Recent lumbar MRI imaging shows broad-based right paracentral disc protrusion at L4-L5. There is right sided lateral recess stenosis at this level affecting L5 nerve root. She was previously managed by Dr. Evalene Chang. Prior lumbar MRI imaging from 2010 does show disc protrusion at L4-L5 without significant compression. Patient denies focal weakness, numbness and tingling. No recent trauma or falls.      Review of Systems  Musculoskeletal:  Positive for back pain.  Neurological:  Negative for tingling, sensory change, focal weakness and weakness.  All other systems reviewed and are negative.  Otherwise per HPI.  Assessment & Plan: Visit Diagnoses:    ICD-10-CM   1. Chronic bilateral low back pain with left-sided sciatica  G89.29    M54.42     2. Lumbar radiculopathy  M54.16     3. Intervertebral disc disorders with radiculopathy, lumbar region  M51.16        Plan: Findings:  Chronic bilateral lower back pain radiating to hips, left buttock and down lateral leg to great toe.  She continues with home exercise regimen, rest and  use of medications.  Overall, her pain is not significantly severe today.  She really complains of pain with prolonged sitting.  I discussed recent lumbar MRI with her today using imaging and spine model.  There is right paracentral disc protrusion at the level of L4-L5 that could be a pain generator for her.  I explained to her that this paracentral disc herniation could cause bilateral symptoms. We discussed treatment plan in detail today. Should her pain return would recommend performing diagnostic and hopefully therapeutic left L4-L5 interlaminar epidural steroid injection. I discussed injection procedure in detail today, she has no questions at this time. I encouraged patient to remain active as tolerated. No red flag symptoms noted upon exam today.     Meds & Orders: No orders of the defined types were placed in this encounter.  No orders of the defined types were placed in this encounter.   Follow-up: Return if symptoms worsen or fail to improve.   Procedures: No procedures performed      Clinical History: CLINICAL DATA:  Low back pain   EXAM: MRI LUMBAR SPINE WITHOUT CONTRAST   TECHNIQUE: Multiplanar, multisequence MR imaging of the lumbar spine was performed. No intravenous contrast was administered.   COMPARISON:  X-ray 12/30/2023   FINDINGS: Segmentation:  Standard.   Alignment:  Trace retrolisthesis at L5-S1.   Vertebrae:  No fracture, evidence of discitis, or bone lesion.   Conus medullaris and cauda equina: Conus extends to the L1 level. Conus and cauda equina appear normal.   Paraspinal  and other soft tissues: Negative.   Disc levels:   T12-L1 through L3-L4: No significant disc protrusion, foraminal stenosis, or canal stenosis.   L4-L5: Disc desiccation and height loss. Annular disc bulge with broad-based right paracentral disc protrusion. Mild-to-moderate bilateral facet arthropathy, worse on the left. Right-sided subarticular recess stenosis, likely  affecting the descending right L5 nerve root. Mild canal stenosis. Moderate bilateral foraminal stenosis is worse on the right.   L5-S1: Moderate bilateral facet arthropathy. Mild annular disc bulge. No canal stenosis. Moderate-severe left and moderate right foraminal stenosis.   IMPRESSION: 1. Lumbar spondylosis most pronounced at L4-L5 and L5-S1. 2. Mild canal stenosis at L4-L5 with right-sided subarticular recess stenosis, likely affecting the descending right L5 nerve root. Moderate bilateral foraminal stenosis at this level is worse on the right. 3. Moderate-severe left and moderate right foraminal stenosis at L5-S1.     Electronically Signed   By: Mabel Converse D.O.   On: 01/13/2024 10:16   She reports that she has never smoked. She has never been exposed to tobacco smoke. She has never used smokeless tobacco. No results for input(s): HGBA1C, LABURIC in the last 8760 hours.  Objective:  VS:  HT:    WT:   BMI:     BP:   HR: bpm  TEMP: ( )  RESP:  Physical Exam Vitals and nursing note reviewed.  HENT:     Head: Normocephalic and atraumatic.     Right Ear: External ear normal.     Left Ear: External ear normal.     Nose: Nose normal.     Mouth/Throat:     Mouth: Mucous membranes are moist.  Eyes:     Extraocular Movements: Extraocular movements intact.  Cardiovascular:     Rate and Rhythm: Normal rate.     Pulses: Normal pulses.  Pulmonary:     Effort: Pulmonary effort is normal.  Abdominal:     General: Abdomen is flat. There is no distension.  Musculoskeletal:        General: Tenderness present.     Cervical back: Normal range of motion.     Comments: Patient rises from seated position to standing without difficulty. Good lumbar range of motion. No pain noted with facet loading. 5/5 strength noted with bilateral hip flexion, knee flexion/extension, ankle dorsiflexion/plantarflexion and EHL. No clonus noted bilaterally. No pain upon palpation of greater  trochanters. No pain with internal/external rotation of bilateral hips. Sensation intact bilaterally. Negative slump test bilaterally. Dysesthesias noted to left L5 dermatome. Ambulates without aid, gait steady.       Skin:    General: Skin is warm and dry.     Capillary Refill: Capillary refill takes less than 2 seconds.  Neurological:     General: No focal deficit present.     Mental Status: She is alert and oriented to person, place, and time.  Psychiatric:        Mood and Affect: Mood normal.        Behavior: Behavior normal.     Ortho Exam  Imaging: No results found.  Past Medical/Family/Surgical/Social History: Medications & Allergies reviewed per EMR, new medications updated. Patient Active Problem List   Diagnosis Date Noted   Chronic constipation 12/23/2023   Family history of heart disease 12/23/2023   Chronic midline low back pain with left-sided sciatica 12/12/2023   Lumbar radicular pain 10/21/2023   Muscle spasm 10/21/2023   UTI (urinary tract infection), bacterial 08/26/2023   Dysuria 08/26/2023   Lightheadedness 05/02/2023  Paresthesia of left leg 05/02/2023   Encounter for general adult medical examination w/o abnormal findings 12/23/2022   History of anemia 12/23/2022   Impacted cerumen of right ear 12/23/2022   Rash and nonspecific skin eruption 04/09/2021   Pruritus 04/09/2021   Thrombocytopenia 01/11/2021   Iron deficiency anemia 01/11/2021   Acute low back pain without sciatica 06/10/2018   Past Medical History:  Diagnosis Date   Anemia    Bacterial vaginosis    Breast density 03/17/2006   right breast    Constipation    Gestational thrombocytopenia    ITP (idiopathic thrombocytopenic purpura)    Family History  Problem Relation Age of Onset   Hypertension Maternal Grandmother    Cancer Paternal Grandfather        lung   Heart disease Other        maternal great-grandmother heart attack    Hypertension Mother    Non-Hodgkin's lymphoma  Father    Cancer Father    COPD Maternal Grandfather    Heart disease Brother    Hypertension Brother    Stroke Brother    Hypertension Brother    Stroke Brother    Past Surgical History:  Procedure Laterality Date   CESAREAN SECTION  02/18/2001   Dr. Armond   ENDOMETRIAL BIOPSY  11/01/2009   TUBAL LIGATION  02/18/2001   [facility]   Social History   Occupational History   Not on file  Tobacco Use   Smoking status: Never    Passive exposure: Never   Smokeless tobacco: Never  Vaping Use   Vaping status: Never Used  Substance and Sexual Activity   Alcohol use: No   Drug use: No   Sexual activity: Yes    Birth control/protection: Surgical    Comment: BTL

## 2024-01-20 NOTE — Progress Notes (Signed)
 Pain Scale   Average Pain 1 Patient advising she has lower back pain that radiates to her left leg, pain increase at different times and during different activities. Patient her forr MRI review        +Driver, -BT, -Dye Allergies.

## 2024-01-25 DIAGNOSIS — F4323 Adjustment disorder with mixed anxiety and depressed mood: Secondary | ICD-10-CM | POA: Diagnosis not present

## 2024-02-10 ENCOUNTER — Other Ambulatory Visit: Payer: Self-pay | Admitting: Internal Medicine

## 2024-02-10 ENCOUNTER — Other Ambulatory Visit: Payer: Self-pay

## 2024-02-10 DIAGNOSIS — K5909 Other constipation: Secondary | ICD-10-CM

## 2024-02-10 MED ORDER — TRULANCE 3 MG PO TABS: ORAL_TABLET | ORAL | 2 refills | Status: AC

## 2024-02-15 ENCOUNTER — Other Ambulatory Visit: Payer: Self-pay

## 2024-02-15 MED ORDER — MELOXICAM 15 MG PO TABS: 15.0000 mg | ORAL_TABLET | Freq: Every day | ORAL | 1 refills | Status: AC | PRN

## 2024-02-16 ENCOUNTER — Other Ambulatory Visit: Payer: Self-pay | Admitting: Internal Medicine

## 2024-02-22 DIAGNOSIS — F4323 Adjustment disorder with mixed anxiety and depressed mood: Secondary | ICD-10-CM | POA: Diagnosis not present

## 2024-03-19 ENCOUNTER — Other Ambulatory Visit: Payer: Self-pay | Admitting: Nurse Practitioner

## 2024-03-19 ENCOUNTER — Encounter: Payer: Self-pay | Admitting: Nurse Practitioner

## 2024-03-19 DIAGNOSIS — J111 Influenza due to unidentified influenza virus with other respiratory manifestations: Secondary | ICD-10-CM

## 2024-03-19 MED ORDER — OSELTAMIVIR PHOSPHATE 75 MG PO CAPS: 75.0000 mg | ORAL_CAPSULE | Freq: Two times a day (BID) | ORAL | 0 refills | Status: AC

## 2024-03-19 MED ORDER — HYDROCODONE BIT-HOMATROP MBR 5-1.5 MG/5ML PO SOLN: 5.0000 mL | Freq: Four times a day (QID) | ORAL | 0 refills | Status: AC | PRN

## 2024-06-02 ENCOUNTER — Ambulatory Visit: Admitting: Dermatology

## 2024-12-26 ENCOUNTER — Encounter: Payer: Self-pay | Admitting: Internal Medicine
